# Patient Record
Sex: Male | Born: 1985 | Hispanic: Yes | Marital: Single | Smoking: Former smoker
Health system: Southern US, Community
[De-identification: ages and names within clinical notes are randomized; demographics above are authoritative.]

## PROBLEM LIST (undated history)

## (undated) DIAGNOSIS — E860 Dehydration: Secondary | ICD-10-CM

---
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
## 2018-12-01 ENCOUNTER — Encounter (HOSPITAL_COMMUNITY): Payer: Self-pay

## 2018-12-01 ENCOUNTER — Emergency Department (HOSPITAL_COMMUNITY)
Admission: EM | Admit: 2018-12-01 | Discharge: 2018-12-01 | Disposition: A | Discharge status: Home / Self Care | Payer: Self-pay | Attending: Emergency Medicine | Admitting: Emergency Medicine

## 2018-12-01 ENCOUNTER — Other Ambulatory Visit: Payer: Self-pay

## 2018-12-01 DIAGNOSIS — R197 Diarrhea, unspecified: Secondary | ICD-10-CM | POA: Insufficient documentation

## 2018-12-01 DIAGNOSIS — R1084 Generalized abdominal pain: Secondary | ICD-10-CM | POA: Insufficient documentation

## 2018-12-01 DIAGNOSIS — F17210 Nicotine dependence, cigarettes, uncomplicated: Secondary | ICD-10-CM | POA: Insufficient documentation

## 2018-12-01 DIAGNOSIS — E860 Dehydration: Secondary | ICD-10-CM | POA: Insufficient documentation

## 2018-12-01 DIAGNOSIS — R112 Nausea with vomiting, unspecified: Secondary | ICD-10-CM | POA: Insufficient documentation

## 2018-12-01 DIAGNOSIS — R6883 Chills (without fever): Secondary | ICD-10-CM | POA: Insufficient documentation

## 2018-12-01 HISTORY — DX: Dehydration: E86.0

## 2018-12-01 LAB — CBC WITH DIFFERENTIAL/PLATELET
Abs Immature Granulocytes: 0.01 10*3/uL (ref 0.00–0.07)
Basophils Absolute: 0 10*3/uL (ref 0.0–0.1)
Basophils Relative: 1 %
Eosinophils Absolute: 0 10*3/uL (ref 0.0–0.5)
Eosinophils Relative: 1 %
HCT: 51.7 % (ref 39.0–52.0)
Hemoglobin: 17.1 g/dL — ABNORMAL HIGH (ref 13.0–17.0)
Immature Granulocytes: 0 %
Lymphocytes Relative: 28 %
Lymphs Abs: 1.8 10*3/uL (ref 0.7–4.0)
MCH: 27.9 pg (ref 26.0–34.0)
MCHC: 33.1 g/dL (ref 30.0–36.0)
MCV: 84.2 fL (ref 80.0–100.0)
Monocytes Absolute: 0.5 10*3/uL (ref 0.1–1.0)
Monocytes Relative: 7 %
Neutro Abs: 3.9 10*3/uL (ref 1.7–7.7)
Neutrophils Relative %: 63 %
Platelets: 288 10*3/uL (ref 150–400)
RBC: 6.14 MIL/uL — ABNORMAL HIGH (ref 4.22–5.81)
RDW: 12.9 % (ref 11.5–15.5)
WBC: 6.2 10*3/uL (ref 4.0–10.5)
nRBC: 0 % (ref 0.0–0.2)

## 2018-12-01 LAB — URINALYSIS, ROUTINE W REFLEX MICROSCOPIC
Bacteria, UA: NONE SEEN
Bilirubin Urine: NEGATIVE
Glucose, UA: NEGATIVE mg/dL
Ketones, ur: 80 mg/dL — AB
Leukocytes,Ua: NEGATIVE
Nitrite: NEGATIVE
Protein, ur: NEGATIVE mg/dL
Specific Gravity, Urine: 1.023 (ref 1.005–1.030)
pH: 7 (ref 5.0–8.0)

## 2018-12-01 LAB — COMPREHENSIVE METABOLIC PANEL
ALT: 12 U/L (ref 0–44)
AST: 12 U/L — ABNORMAL LOW (ref 15–41)
Albumin: 4.6 g/dL (ref 3.5–5.0)
Alkaline Phosphatase: 56 U/L (ref 38–126)
Anion gap: 9 (ref 5–15)
BUN: 13 mg/dL (ref 6–20)
CO2: 22 mmol/L (ref 22–32)
Calcium: 9.5 mg/dL (ref 8.9–10.3)
Chloride: 106 mmol/L (ref 98–111)
Creatinine, Ser: 0.99 mg/dL (ref 0.61–1.24)
GFR calc Af Amer: >60 mL/min (ref >60)
GFR calc non Af Amer: >60 mL/min (ref >60)
Glucose, Bld: 89 mg/dL (ref 70–99)
Potassium: 3.6 mmol/L (ref 3.5–5.1)
Sodium: 137 mmol/L (ref 135–145)
Total Bilirubin: 1.2 mg/dL (ref 0.3–1.2)
Total Protein: 8.3 g/dL — ABNORMAL HIGH (ref 6.5–8.1)

## 2018-12-01 LAB — LIPASE, BLOOD: Lipase: 29 U/L (ref 11–51)

## 2018-12-01 MED ORDER — LACTATED RINGERS IV BOLUS
1000.0000 mL | Freq: Once | INTRAVENOUS | Status: AC
  Administered 2018-12-01: 18:00:00 1000 mL via INTRAVENOUS

## 2018-12-01 MED ORDER — ONDANSETRON HCL 4 MG/2ML IJ SOLN
4.0000 mg | Freq: Once | INTRAMUSCULAR | Status: AC
  Administered 2018-12-01: 16:00:00 4 mg via INTRAVENOUS
  Filled 2018-12-01: qty 2

## 2018-12-01 MED ORDER — FAMOTIDINE IN NACL 20-0.9 MG/50ML-% IV SOLN
20.0000 mg | Freq: Once | INTRAVENOUS | Status: AC
  Administered 2018-12-01: 18:00:00 20 mg via INTRAVENOUS
  Filled 2018-12-01: qty 50

## 2018-12-01 MED ORDER — SODIUM CHLORIDE 0.9 % IV BOLUS
1000.0000 mL | Freq: Once | INTRAVENOUS | Status: AC
  Administered 2018-12-01: 16:00:00 1000 mL via INTRAVENOUS

## 2018-12-01 MED ORDER — DICYCLOMINE HCL 20 MG PO TABS: 20.0000 mg | ORAL_TABLET | Freq: Three times a day (TID) | ORAL | 0 refills | Status: DC | PRN

## 2018-12-01 MED ORDER — PROMETHAZINE HCL 25 MG PO TABS: 25.0000 mg | ORAL_TABLET | Freq: Three times a day (TID) | ORAL | 0 refills | Status: DC | PRN

## 2018-12-01 MED ORDER — DICYCLOMINE HCL 10 MG PO CAPS
10.0000 mg | ORAL_CAPSULE | Freq: Once | ORAL | Status: AC
  Administered 2018-12-01: 18:00:00 10 mg via ORAL
  Filled 2018-12-01: qty 1

## 2018-12-01 NOTE — ED Provider Notes (Signed)
[facility] DEPT Provider Note   CSN: [id number] Arrival date & time: 12/01/18  1515    History   Chief Complaint Chief Complaint  Patient presents with  . Weakness    HPI Lawrence Fischer is a 33 y.o. male.     The history is provided by the patient. No language interpreter was used.  Weakness   Lawrence Fischer is a 33 y.o. male who presents to the Emergency Department complaining of N/V/D/abdominal pain. Resents to the emergency department complaining of five days of nausea, vomiting, diarrhea and abdominal pain. Symptoms began with severe diarrhea with greater than five episodes of watery stool daily. He has generalized abdominal cramping as well as a couple of episodes of small volume emesis daily. Overall his diarrhea is improving and his vomiting is resolved since yesterday. He denies any fevers but does have sweats that time as well as chills. No dysuria. He has experienced similar episodes in the past related to dehydration. At home he has been taking Dramamine as well is Pedialyte with no significant improvement in his symptoms. He complains of feeling it very dehydrated and weak. He has no known problems. No prior abdominal surgeries. He does smoke With pack of cigarettes daily and uses occasional marijuana.  Past Medical History:  Diagnosis Date  . Dehydration     There are no active problems to display for this patient.   Past Surgical History:  Procedure Laterality Date  . KNEE SURGERY  2004        Home Medications    Prior to Admission medications   Medication Sig Start Date End Date Taking? Authorizing Provider  dicyclomine (BENTYL) 20 MG tablet Take 1 tablet (20 mg total) by mouth 3 (three) times daily as needed for spasms. 12/01/18   Quintella Reichert, MD  promethazine (PHENERGAN) 25 MG tablet Take 1 tablet (25 mg total) by mouth every 8 (eight) hours as needed for nausea or vomiting. 12/01/18   Quintella Reichert, MD     Family History No family history on file.  Social History Social History   Tobacco Use  . Smoking status: Current Every Day Smoker    Packs/day: 0.50    Types: Cigarettes  . Smokeless tobacco: Never Used  Substance Use Topics  . Alcohol use: Never    Frequency: Never  . Drug use: Yes    Types: Marijuana     Allergies   Patient has no known allergies.   Review of Systems Review of Systems  Neurological: Positive for weakness.  All other systems reviewed and are negative.    Physical Exam Updated Vital Signs BP 129/89 (BP Location: Left Arm)   Pulse 72   Temp 98.6 F (37 C) (Oral)   Resp 16   Ht 5\' 10"  (1.778 m)   Wt 72.6 kg   SpO2 100%   BMI 22.96 kg/m   Physical Exam Vitals signs and nursing note reviewed.  Constitutional:      Appearance: He is well-developed.  HENT:     Head: Normocephalic and atraumatic.  Cardiovascular:     Rate and Rhythm: Normal rate and regular rhythm.     Heart sounds: No murmur.  Pulmonary:     Effort: Pulmonary effort is normal. No respiratory distress.     Breath sounds: Normal breath sounds.  Abdominal:     Palpations: Abdomen is soft.     Tenderness: There is no guarding or rebound.     Comments: Mild generalized abdominal  tenderness.  Musculoskeletal:        General: No swelling or tenderness.  Skin:    General: Skin is warm and dry.     Capillary Refill: Capillary refill takes less than 2 seconds.  Neurological:     Mental Status: He is alert and oriented to person, place, and time.  Psychiatric:        Behavior: Behavior normal.      ED Treatments / Results  Labs (all labs ordered are listed, but only abnormal results are displayed) Labs Reviewed  COMPREHENSIVE METABOLIC PANEL - Abnormal; Notable for the following components:      Result Value   Total Protein 8.3 (*)    AST 12 (*)    All other components within normal limits  CBC WITH DIFFERENTIAL/PLATELET - Abnormal; Notable for the following  components:   RBC 6.14 (*)    Hemoglobin 17.1 (*)    All other components within normal limits  URINALYSIS, ROUTINE W REFLEX MICROSCOPIC - Abnormal; Notable for the following components:   Hgb urine dipstick MODERATE (*)    Ketones, ur 80 (*)    All other components within normal limits  LIPASE, BLOOD    EKG None  Radiology No results found.  Procedures Procedures (including critical care time)  Medications Ordered in ED Medications  sodium chloride 0.9 % bolus 1,000 mL (0 mLs Intravenous Stopped 12/01/18 1746)  ondansetron (ZOFRAN) injection 4 mg (4 mg Intravenous Given 12/01/18 1622)  lactated ringers bolus 1,000 mL (0 mLs Intravenous Stopped 12/01/18 1927)  famotidine (PEPCID) IVPB 20 mg premix (0 mg Intravenous Stopped 12/01/18 1927)  dicyclomine (BENTYL) capsule 10 mg (10 mg Oral Given 12/01/18 1817)     Initial Impression / Assessment and Plan / ED Course  I have reviewed the triage vital signs and the nursing notes.  Pertinent labs & imaging results that were available during my care of the patient were reviewed by me and considered in my medical decision making (see chart for details).        Patient here for evaluation of nausea, vomiting, diarrhea and abdominal pain. He is non-toxic appearing on evaluation with mild abdominal tenderness. Labs are significant for elevation and hemoglobin, likely related to dehydration. Presentation is not consistent with pancreatitis, cholecystitis, appendicitis, bowel obstruction, preparation. Following treatment with IV fluids medications in the emergency department he is feeling improved. Counseled patient on home care for dehydration, vomiting, diarrhea. Discussed outpatient follow-up and return precautions.  Final Clinical Impressions(s) / ED Diagnoses   Final diagnoses:  Dehydration  Nausea vomiting and diarrhea    ED Discharge Orders         Ordered    promethazine (PHENERGAN) 25 MG tablet  Every 8 hours PRN     12/01/18 1937     dicyclomine (BENTYL) 20 MG tablet  3 times daily PRN     12/01/18 1937           Tilden Fossaees, Rhemi Balbach, MD 12/01/18 2349

## 2018-12-01 NOTE — ED Triage Notes (Signed)
Pt states that he feels dehydrated. Pt states that he has been unable to eat. Pt endorses diarrhea 4 days, and vomiting 2.5 days. Pt states that he feels very weak. Pt states he has been trying to drink pedialyte.

## 2018-12-01 NOTE — ED Notes (Signed)
Pt is able to tolerate PO liquids (Sprite).

## 2018-12-01 NOTE — ED Notes (Signed)
Pt unable to pee at this very moment. Will try again soon

## 2019-03-05 ENCOUNTER — Other Ambulatory Visit: Payer: Self-pay

## 2019-03-05 ENCOUNTER — Emergency Department (HOSPITAL_COMMUNITY)
Admission: EM | Admit: 2019-03-05 | Discharge: 2019-03-06 | Discharge status: Against Medical Advice | Payer: Medicaid Other | Attending: Emergency Medicine | Admitting: Emergency Medicine

## 2019-03-05 ENCOUNTER — Encounter (HOSPITAL_COMMUNITY): Payer: Self-pay | Admitting: Emergency Medicine

## 2019-03-05 DIAGNOSIS — R112 Nausea with vomiting, unspecified: Secondary | ICD-10-CM | POA: Diagnosis present

## 2019-03-05 DIAGNOSIS — Z5321 Procedure and treatment not carried out due to patient leaving prior to being seen by health care provider: Secondary | ICD-10-CM | POA: Insufficient documentation

## 2019-03-05 LAB — CBC
HCT: 50.1 % (ref 39.0–52.0)
Hemoglobin: 16.4 g/dL (ref 13.0–17.0)
MCH: 27.7 pg (ref 26.0–34.0)
MCHC: 32.7 g/dL (ref 30.0–36.0)
MCV: 84.6 fL (ref 80.0–100.0)
Platelets: 306 10*3/uL (ref 150–400)
RBC: 5.92 MIL/uL — ABNORMAL HIGH (ref 4.22–5.81)
RDW: 13.6 % (ref 11.5–15.5)
WBC: 9.2 10*3/uL (ref 4.0–10.5)
nRBC: 0 % (ref 0.0–0.2)

## 2019-03-05 LAB — COMPREHENSIVE METABOLIC PANEL
ALT: 7 U/L (ref 0–44)
AST: 10 U/L — ABNORMAL LOW (ref 15–41)
Albumin: 4 g/dL (ref 3.5–5.0)
Alkaline Phosphatase: 48 U/L (ref 38–126)
Anion gap: 13 (ref 5–15)
BUN: 9 mg/dL (ref 6–20)
CO2: 18 mmol/L — ABNORMAL LOW (ref 22–32)
Calcium: 9.3 mg/dL (ref 8.9–10.3)
Chloride: 107 mmol/L (ref 98–111)
Creatinine, Ser: 1.03 mg/dL (ref 0.61–1.24)
GFR calc Af Amer: >60 mL/min (ref >60)
GFR calc non Af Amer: >60 mL/min (ref >60)
Glucose, Bld: 94 mg/dL (ref 70–99)
Potassium: 4 mmol/L (ref 3.5–5.1)
Sodium: 138 mmol/L (ref 135–145)
Total Bilirubin: 0.9 mg/dL (ref 0.3–1.2)
Total Protein: 7.3 g/dL (ref 6.5–8.1)

## 2019-03-05 LAB — LACTIC ACID, PLASMA: Lactic Acid, Venous: 1.1 mmol/L (ref 0.5–1.9)

## 2019-03-05 LAB — LIPASE, BLOOD: Lipase: 28 U/L (ref 11–51)

## 2019-03-05 MED ORDER — ONDANSETRON 4 MG PO TBDP
4.0000 mg | ORAL_TABLET | Freq: Once | ORAL | Status: AC | PRN
  Administered 2019-03-05: 4 mg via ORAL
  Filled 2019-03-05: qty 1

## 2019-03-05 MED ORDER — SODIUM CHLORIDE 0.9% FLUSH: 3.0000 mL | Freq: Once | INTRAVENOUS | Status: DC

## 2019-03-05 NOTE — ED Triage Notes (Signed)
C/O of nausea, vomiting and abdominal pain X 1day. Pt states he has chronic severe dehydration and the following have symptoms have occured before. Reports he has been drinking liquid IV and plenty of water.

## 2019-03-05 NOTE — ED Notes (Signed)
Pt did not respond when called for vitals check 

## 2019-05-11 ENCOUNTER — Observation Stay (HOSPITAL_COMMUNITY)
Admission: EM | Admit: 2019-05-11 | Discharge: 2019-05-12 | Disposition: A | Discharge status: Home / Self Care | Payer: Medicaid Other | Attending: Internal Medicine | Admitting: Internal Medicine

## 2019-05-11 ENCOUNTER — Other Ambulatory Visit: Payer: Self-pay

## 2019-05-11 ENCOUNTER — Encounter (HOSPITAL_COMMUNITY): Payer: Self-pay

## 2019-05-11 ENCOUNTER — Emergency Department (HOSPITAL_COMMUNITY): Payer: Medicaid Other

## 2019-05-11 ENCOUNTER — Observation Stay (HOSPITAL_COMMUNITY): Payer: Medicaid Other

## 2019-05-11 DIAGNOSIS — I4510 Unspecified right bundle-branch block: Secondary | ICD-10-CM | POA: Insufficient documentation

## 2019-05-11 DIAGNOSIS — R823 Hemoglobinuria: Secondary | ICD-10-CM | POA: Diagnosis present

## 2019-05-11 DIAGNOSIS — Z79899 Other long term (current) drug therapy: Secondary | ICD-10-CM | POA: Diagnosis not present

## 2019-05-11 DIAGNOSIS — Z20828 Contact with and (suspected) exposure to other viral communicable diseases: Secondary | ICD-10-CM | POA: Diagnosis not present

## 2019-05-11 DIAGNOSIS — R112 Nausea with vomiting, unspecified: Secondary | ICD-10-CM | POA: Diagnosis present

## 2019-05-11 DIAGNOSIS — F1290 Cannabis use, unspecified, uncomplicated: Secondary | ICD-10-CM | POA: Insufficient documentation

## 2019-05-11 DIAGNOSIS — Z8711 Personal history of peptic ulcer disease: Secondary | ICD-10-CM | POA: Diagnosis not present

## 2019-05-11 DIAGNOSIS — F17210 Nicotine dependence, cigarettes, uncomplicated: Secondary | ICD-10-CM | POA: Insufficient documentation

## 2019-05-11 DIAGNOSIS — E876 Hypokalemia: Secondary | ICD-10-CM | POA: Diagnosis present

## 2019-05-11 DIAGNOSIS — R1115 Cyclical vomiting syndrome unrelated to migraine: Secondary | ICD-10-CM | POA: Diagnosis present

## 2019-05-11 LAB — HIV ANTIBODY (ROUTINE TESTING W REFLEX): HIV Screen 4th Generation wRfx: NONREACTIVE

## 2019-05-11 LAB — URINALYSIS, ROUTINE W REFLEX MICROSCOPIC
Bacteria, UA: NONE SEEN
Bilirubin Urine: NEGATIVE
Glucose, UA: NEGATIVE mg/dL
Ketones, ur: 80 mg/dL — AB
Leukocytes,Ua: NEGATIVE
Nitrite: NEGATIVE
Protein, ur: NEGATIVE mg/dL
Specific Gravity, Urine: 1.019 (ref 1.005–1.030)
pH: 5 (ref 5.0–8.0)

## 2019-05-11 LAB — CBC
HCT: 45.6 % (ref 39.0–52.0)
HCT: 45 % (ref 39.0–52.0)
Hemoglobin: 15.6 g/dL (ref 13.0–17.0)
Hemoglobin: 14.5 g/dL (ref 13.0–17.0)
MCH: 28.2 pg (ref 26.0–34.0)
MCH: 28 pg (ref 26.0–34.0)
MCHC: 34.2 g/dL (ref 30.0–36.0)
MCHC: 32.2 g/dL (ref 30.0–36.0)
MCV: 82.3 fL (ref 80.0–100.0)
MCV: 86.9 fL (ref 80.0–100.0)
Platelets: 306 10*3/uL (ref 150–400)
Platelets: 270 10*3/uL (ref 150–400)
RBC: 5.54 MIL/uL (ref 4.22–5.81)
RBC: 5.18 MIL/uL (ref 4.22–5.81)
RDW: 13.3 % (ref 11.5–15.5)
RDW: 13.5 % (ref 11.5–15.5)
WBC: 11.3 10*3/uL — ABNORMAL HIGH (ref 4.0–10.5)
WBC: 10.6 10*3/uL — ABNORMAL HIGH (ref 4.0–10.5)
nRBC: 0 % (ref 0.0–0.2)
nRBC: 0 % (ref 0.0–0.2)

## 2019-05-11 LAB — COMPREHENSIVE METABOLIC PANEL
ALT: 12 U/L (ref 0–44)
AST: 17 U/L (ref 15–41)
Albumin: 4.3 g/dL (ref 3.5–5.0)
Alkaline Phosphatase: 52 U/L (ref 38–126)
Anion gap: 15 (ref 5–15)
BUN: 12 mg/dL (ref 6–20)
CO2: 19 mmol/L — ABNORMAL LOW (ref 22–32)
Calcium: 9.6 mg/dL (ref 8.9–10.3)
Chloride: 105 mmol/L (ref 98–111)
Creatinine, Ser: 1.11 mg/dL (ref 0.61–1.24)
GFR calc Af Amer: >60 mL/min (ref >60)
GFR calc non Af Amer: >60 mL/min (ref >60)
Glucose, Bld: 118 mg/dL — ABNORMAL HIGH (ref 70–99)
Potassium: 3.1 mmol/L — ABNORMAL LOW (ref 3.5–5.1)
Sodium: 139 mmol/L (ref 135–145)
Total Bilirubin: 1.2 mg/dL (ref 0.3–1.2)
Total Protein: 7.4 g/dL (ref 6.5–8.1)

## 2019-05-11 LAB — CK: Total CK: 118 U/L (ref 49–397)

## 2019-05-11 LAB — HEMOGLOBIN A1C
Hgb A1c MFr Bld: 5.4 % (ref 4.8–5.6)
Mean Plasma Glucose: 108.28 mg/dL

## 2019-05-11 LAB — PHOSPHORUS: Phosphorus: 3.3 mg/dL (ref 2.5–4.6)

## 2019-05-11 LAB — MAGNESIUM: Magnesium: 1.8 mg/dL (ref 1.7–2.4)

## 2019-05-11 LAB — LIPASE, BLOOD: Lipase: 33 U/L (ref 11–51)

## 2019-05-11 LAB — TROPONIN I (HIGH SENSITIVITY): Troponin I (High Sensitivity): 4 ng/L (ref <18)

## 2019-05-11 LAB — CREATININE, SERUM
Creatinine, Ser: 1.04 mg/dL (ref 0.61–1.24)
GFR calc Af Amer: >60 mL/min (ref >60)
GFR calc non Af Amer: >60 mL/min (ref >60)

## 2019-05-11 LAB — SARS CORONAVIRUS 2 (TAT 6-24 HRS): SARS Coronavirus 2: NEGATIVE

## 2019-05-11 MED ORDER — POTASSIUM CHLORIDE 20 MEQ PO PACK
30.0000 meq | PACK | Freq: Four times a day (QID) | ORAL | Status: AC
  Administered 2019-05-11 (×2): 30 meq via ORAL
  Filled 2019-05-11 (×2): qty 2

## 2019-05-11 MED ORDER — PROMETHAZINE HCL 25 MG/ML IJ SOLN
25.0000 mg | Freq: Once | INTRAMUSCULAR | Status: AC
  Administered 2019-05-11: 05:00:00 25 mg via INTRAVENOUS
  Filled 2019-05-11 (×2): qty 1

## 2019-05-11 MED ORDER — METOCLOPRAMIDE HCL 5 MG/ML IJ SOLN
10.0000 mg | Freq: Once | INTRAMUSCULAR | Status: AC
  Administered 2019-05-11: 10 mg via INTRAVENOUS
  Filled 2019-05-11: qty 2

## 2019-05-11 MED ORDER — POTASSIUM CHLORIDE 10 MEQ/100ML IV SOLN: 10.0000 meq | INTRAVENOUS | Status: DC

## 2019-05-11 MED ORDER — FAMOTIDINE IN NACL 20-0.9 MG/50ML-% IV SOLN
20.0000 mg | Freq: Once | INTRAVENOUS | Status: AC
  Administered 2019-05-11: 05:00:00 20 mg via INTRAVENOUS
  Filled 2019-05-11: qty 50

## 2019-05-11 MED ORDER — SODIUM CHLORIDE 0.9 % IV BOLUS
1000.0000 mL | Freq: Once | INTRAVENOUS | Status: AC
  Administered 2019-05-11: 1000 mL via INTRAVENOUS

## 2019-05-11 MED ORDER — POTASSIUM CHLORIDE 10 MEQ/100ML IV SOLN
10.0000 meq | INTRAVENOUS | Status: DC
  Filled 2019-05-11: qty 100

## 2019-05-11 MED ORDER — FAMOTIDINE IN NACL 20-0.9 MG/50ML-% IV SOLN: 20.0000 mg | INTRAVENOUS | Status: DC

## 2019-05-11 MED ORDER — POTASSIUM CHLORIDE 2 MEQ/ML IV SOLN
INTRAVENOUS | Status: DC
  Filled 2019-05-11: qty 1000

## 2019-05-11 MED ORDER — ENOXAPARIN SODIUM 40 MG/0.4ML ~~LOC~~ SOLN
40.0000 mg | SUBCUTANEOUS | Status: DC
  Administered 2019-05-12: 10:00:00 40 mg via SUBCUTANEOUS
  Filled 2019-05-11: qty 0.4

## 2019-05-11 MED ORDER — POTASSIUM CHLORIDE 2 MEQ/ML IV SOLN
INTRAVENOUS | Status: DC
  Administered 2019-05-11: 13:00:00 via INTRAVENOUS
  Filled 2019-05-11 (×2): qty 1000

## 2019-05-11 MED ORDER — POTASSIUM CHLORIDE 2 MEQ/ML IV SOLN
INTRAVENOUS | Status: DC
  Administered 2019-05-11 – 2019-05-12 (×2): via INTRAVENOUS
  Filled 2019-05-11 (×2): qty 1000

## 2019-05-11 MED ORDER — PROCHLORPERAZINE EDISYLATE 10 MG/2ML IJ SOLN
10.0000 mg | INTRAMUSCULAR | Status: DC | PRN
  Administered 2019-05-11 – 2019-05-12 (×3): 10 mg via INTRAVENOUS
  Filled 2019-05-11 (×3): qty 2

## 2019-05-11 MED ORDER — ONDANSETRON HCL 4 MG/2ML IJ SOLN
4.0000 mg | Freq: Once | INTRAMUSCULAR | Status: AC | PRN
  Administered 2019-05-11: 02:00:00 4 mg via INTRAVENOUS
  Filled 2019-05-11: qty 2

## 2019-05-11 MED ORDER — ONDANSETRON HCL 4 MG/2ML IJ SOLN
4.0000 mg | Freq: Four times a day (QID) | INTRAMUSCULAR | Status: DC | PRN
  Administered 2019-05-11: 14:00:00 4 mg via INTRAVENOUS
  Filled 2019-05-11: qty 2

## 2019-05-11 MED ORDER — DICYCLOMINE HCL 10 MG PO CAPS: 10.0000 mg | ORAL_CAPSULE | Freq: Once | ORAL | Status: DC

## 2019-05-11 NOTE — ED Triage Notes (Signed)
PT reports he can not take the PO med due to nausea.

## 2019-05-11 NOTE — Progress Notes (Signed)
16:15 - Dr Eileen Stanford, Obed was messaged that Zofran administered at 13:24 did not resolve nausea

## 2019-05-11 NOTE — ED Provider Notes (Signed)
[facility]   CSN: [id number] Arrival date & time: 05/11/19  0128     History   Chief Complaint Chief Complaint  Patient presents with  . Abdominal Pain    HPI Arva ChafeMichael Antonio Oneal Deputystavia is a 33 y.o. male with no significant past medical history who presents to the emergency department with a chief complaint of vomiting.  The patient endorses countless episodes of nonbloody, nonbilious vomiting that began earlier tonight at 20:00.  He also notes that he has been having loose stools for most of the day.  He reports that he is having generalized abdominal pain brought on by vomiting.  He denies fever, chills, cough, chest pain, shortness of breath, dysuria, hematuria, constipation, melena, hematochezia, or penile or testicular pain or swelling.  No treatment prior to arrival.  He was given a dose of Zofran while he was in triage, but has continued to vomit.  He is a current, every day cigarette smoker.  He also endorses occasional marijuana use. No suspicious food intake. No history of kidney stones.       The history is provided by the patient. No language interpreter was used.    Past Medical History:  Diagnosis Date  . Dehydration     There are no active problems to display for this patient.   Past Surgical History:  Procedure Laterality Date  . KNEE SURGERY  2004        Home Medications    Prior to Admission medications   Medication Sig Start Date End Date Taking? Authorizing Provider  dicyclomine (BENTYL) 20 MG tablet Take 1 tablet (20 mg total) by mouth 3 (three) times daily as needed for spasms. Patient not taking: Reported on 05/11/2019 12/01/18 05/11/19  Tilden Fossaees, Elizabeth, MD  promethazine (PHENERGAN) 25 MG tablet Take 1 tablet (25 mg total) by mouth every 8 (eight) hours as needed for nausea or vomiting. Patient not taking: Reported on 05/11/2019 12/01/18 05/11/19  Tilden Fossaees, Elizabeth, MD    Family History No  family history on file.  Social History Social History   Tobacco Use  . Smoking status: Current Every Day Smoker    Packs/day: 0.50    Types: Cigarettes  . Smokeless tobacco: Never Used  Substance Use Topics  . Alcohol use: Never    Frequency: Never  . Drug use: Yes    Types: Marijuana     Allergies   Patient has no known allergies.   Review of Systems Review of Systems  Constitutional: Negative for appetite change, chills and fever.  Respiratory: Negative for shortness of breath.   Cardiovascular: Negative for chest pain.  Gastrointestinal: Positive for abdominal pain, nausea and vomiting. Negative for anal bleeding, blood in stool, constipation and rectal pain.  Genitourinary: Negative for dysuria, flank pain, penile swelling, scrotal swelling and testicular pain.  Musculoskeletal: Negative for back pain, myalgias, neck pain and neck stiffness.  Skin: Negative for rash.  Allergic/Immunologic: Negative for immunocompromised state.  Neurological: Negative for seizures, syncope, weakness, numbness and headaches.  Psychiatric/Behavioral: Negative for confusion.    Physical Exam Updated Vital Signs BP 111/61   Pulse (!) 53   Resp 20   SpO2 98%   Physical Exam Vitals signs and nursing note reviewed.  Constitutional:      Appearance: He is well-developed and normal weight. He is diaphoretic.  HENT:     Head: Normocephalic.     Mouth/Throat:     Comments: Mucous membranes are moist Eyes:  Conjunctiva/sclera: Conjunctivae normal.  Neck:     Musculoskeletal: Neck supple.  Cardiovascular:     Rate and Rhythm: Normal rate and regular rhythm.     Heart sounds: No murmur.  Pulmonary:     Effort: Pulmonary effort is normal. No respiratory distress.     Breath sounds: No stridor. No wheezing, rhonchi or rales.  Chest:     Chest wall: No tenderness.  Abdominal:     General: There is no distension.     Palpations: Abdomen is soft. There is no mass.     Tenderness:  There is no abdominal tenderness. There is no right CVA tenderness, left CVA tenderness, guarding or rebound.     Hernia: No hernia is present.     Comments: Abdomen is soft, nondistended.  Mild generalized tenderness to palpation throughout the abdomen.  No rebound or guarding.  Negative Murphy sign.  No tenderness over McBurney's point.  No CVA tenderness bilaterally.  Skin:    General: Skin is warm.     Capillary Refill: Capillary refill takes 2 to 3 seconds.  Neurological:     Mental Status: He is alert.  Psychiatric:        Behavior: Behavior normal.      ED Treatments / Results  Labs (all labs ordered are listed, but only abnormal results are displayed) Labs Reviewed  COMPREHENSIVE METABOLIC PANEL - Abnormal; Notable for the following components:      Result Value   Potassium 3.1 (*)    CO2 19 (*)    Glucose, Bld 118 (*)    All other components within normal limits  CBC - Abnormal; Notable for the following components:   WBC 11.3 (*)    All other components within normal limits  URINALYSIS, ROUTINE W REFLEX MICROSCOPIC - Abnormal; Notable for the following components:   Hgb urine dipstick MODERATE (*)    Ketones, ur 80 (*)    All other components within normal limits  LIPASE, BLOOD    EKG None  Radiology No results found.  Procedures Procedures (including critical care time)  Medications Ordered in ED Medications  dicyclomine (BENTYL) capsule 10 mg (has no administration in time range)  ondansetron (ZOFRAN) injection 4 mg (4 mg Intravenous Given 05/11/19 0151)  sodium chloride 0.9 % bolus 1,000 mL (1,000 mLs Intravenous New Bag/Given 05/11/19 0508)  famotidine (PEPCID) IVPB 20 mg premix (0 mg Intravenous Stopped 05/11/19 0542)  promethazine (PHENERGAN) injection 25 mg (25 mg Intravenous Given 05/11/19 0508)  metoCLOPramide (REGLAN) injection 10 mg (10 mg Intravenous Given 05/11/19 0656)  sodium chloride 0.9 % bolus 1,000 mL (1,000 mLs Intravenous New Bag/Given  05/11/19 0656)     Initial Impression / Assessment and Plan / ED Course  I have reviewed the triage vital signs and the nursing notes.  Pertinent labs & imaging results that were available during my care of the patient were reviewed by me and considered in my medical decision making (see chart for details).        33 year old male with no significant past medical history presenting with nausea and vomiting, onset tonight.   He is minimally bradycardic, but asymptomatic.  He is otherwise hemodynamically stable.  He was given Zofran in the waiting room, but continued to have vomiting.  Will trial with Phenergan and Pepcid.  He has a mild hypokalemia of 3.1 likely secondary to fluid loss from vomiting. Mild leukocytosis that is likely reactive as he is having no infectious symptoms.  He does not have  a surgical abdomen.  His urine did have hemoglobinuria, but otherwise did not appear infectious and ketonuria, treated with IV fluids. I have a low suspicion for obstructive uropathy at this time given his presenting symptoms today.  We will provide him with a urology follow-up.   On reevaluation, the patient continued to endorse severe nausea.  Will trial the patient with Reglan and Bentyl.  Could consider his symptoms to be secondary to hyperemesis cannabinoid syndrome versus viral gastritis given symptom onset of less than 12 hours ago.  Low suspicion for cholecystitis, choledocholithiasis, pancreatitis, urosepsis, testicular torsion, or bowel obstruction.  Patient care transferred to PA Albrizze at the end of my shift pending fluid challenge and clinical improvement. Patient presentation, ED course, and plan of care discussed with review of all pertinent labs and imaging. Please see his/her note for further details regarding further ED course and disposition.   Final Clinical Impressions(s) / ED Diagnoses   Final diagnoses:  Non-intractable vomiting with nausea, unspecified vomiting type   Hypokalemia  Hemoglobinuria    ED Discharge Orders    None       Barkley Boards, PA-C 05/11/19 0715    Dione Booze, MD 05/11/19 [phone]

## 2019-05-11 NOTE — ED Provider Notes (Signed)
Care assumed from [place] PA-C at shift change pending fluid challenge.  See her note for full H&P.   Briefly this is a 33 year old male presenting with nausea with emesis as well as loose stool in the last 24 hours.  He estimates over 12 episodes of nonbloody nonbilious emesis.  He admits to smoking marijuana twice a week, states he last smoked last week.  He denies history of cyclical vomiting.  Per previous provider patient has no flank pain, no focal abdominal pain. Labs are significant for hypokalemia 3.1, UA with blood and ketones. So far has had pepcid, phenergan, zofran, IVF. Nausea returned with fluid challenge.  Plan: Reglan and reassess. If tolerates PO challenge will discharge home with plan to follow up with urology outpatient for hemoglobinuria. Discharge with PO potassium.  Physical Exam  BP 128/81   Pulse 66   Temp 98.6 F (37 C) (Oral)   Resp 20   SpO2 99%   Physical Exam  PE: Constitutional: well-developed, well-nourished, no apparent distress HENT: normocephalic, atraumatic. no cervical adenopathy. Mucus membranes are dry Cardiovascular: normal rate and rhythm, distal pulses intact Pulmonary/Chest: effort normal; breath sounds clear and equal bilaterally; no wheezes or rales Abdominal: + bowel sounds in all quadrants. Non tender to palpation. No rigidity, no rebound, no guarding. No CVA tenderness Musculoskeletal: full ROM, no edema Neurological: alert with goal directed thinking Skin: warm and dry, no rash, no diaphoresis Psychiatric: normal mood and affect, normal behavior    ED Course/Procedures   Results for orders placed or performed during the hospital encounter of 05/11/19 (from the past 24 hour(s))  Lipase, blood     Status: None   Collection Time: 05/11/19  1:47 AM  Result Value Ref Range   Lipase 33 11 - 51 U/L  Comprehensive metabolic panel     Status: Abnormal   Collection Time: 05/11/19  1:47 AM  Result Value Ref Range   Sodium 139 135 - 145  mmol/L   Potassium 3.1 (L) 3.5 - 5.1 mmol/L   Chloride 105 98 - 111 mmol/L   CO2 19 (L) 22 - 32 mmol/L   Glucose, Bld 118 (H) 70 - 99 mg/dL   BUN 12 6 - 20 mg/dL   Creatinine, Ser 1.11 0.61 - 1.24 mg/dL   Calcium 9.6 8.9 - 10.3 mg/dL   Total Protein 7.4 6.5 - 8.1 g/dL   Albumin 4.3 3.5 - 5.0 g/dL   AST 17 15 - 41 U/L   ALT 12 0 - 44 U/L   Alkaline Phosphatase 52 38 - 126 U/L   Total Bilirubin 1.2 0.3 - 1.2 mg/dL   GFR calc non Af Amer >60 >60 mL/min   GFR calc Af Amer >60 >60 mL/min   Anion gap 15 5 - 15  CBC     Status: Abnormal   Collection Time: 05/11/19  1:47 AM  Result Value Ref Range   WBC 11.3 (H) 4.0 - 10.5 K/uL   RBC 5.54 4.22 - 5.81 MIL/uL   Hemoglobin 15.6 13.0 - 17.0 g/dL   HCT 45.6 39.0 - 52.0 %   MCV 82.3 80.0 - 100.0 fL   MCH 28.2 26.0 - 34.0 pg   MCHC 34.2 30.0 - 36.0 g/dL   RDW 13.3 11.5 - 15.5 %   Platelets 306 150 - 400 K/uL   nRBC 0.0 0.0 - 0.2 %  Urinalysis, Routine w reflex microscopic     Status: Abnormal   Collection Time: 05/11/19  5:14 AM  Result Value Ref Range   Color, Urine YELLOW YELLOW   APPearance CLEAR CLEAR   Specific Gravity, Urine 1.019 1.005 - 1.030   pH 5.0 5.0 - 8.0   Glucose, UA NEGATIVE NEGATIVE mg/dL   Hgb urine dipstick MODERATE (A) NEGATIVE   Bilirubin Urine NEGATIVE NEGATIVE   Ketones, ur 80 (A) NEGATIVE mg/dL   Protein, ur NEGATIVE NEGATIVE mg/dL   Nitrite NEGATIVE NEGATIVE   Leukocytes,Ua NEGATIVE NEGATIVE   RBC / HPF 6-10 0 - 5 RBC/hpf   WBC, UA 0-5 0 - 5 WBC/hpf   Bacteria, UA NONE SEEN NONE SEEN   Squamous Epithelial / LPF 0-5 0 - 5   Mucus PRESENT       MDM  Patient received in sign out. Symptoms have been less than 24 hours, likely viral.  On my exam patient looks to not feel well however is nontoxic.  He is afebrile with normal vital signs.  His abdomen is nontender, no peritoneal signs.  Given reassuring abdominal exam still have low suspicion for cholecystitis, choledocholithiasis, pancreatitis, testicular  torsion, SBO, urosepsis.  Patient has failed p.o. challenge after receiving Pepcid, Phenergan, Zofran and Reglan.  Will consult for unassigned admission for intractable vomiting. Spoke with Dr. Oswaldo Done with Internal medicine service who agrees to assume care of patient and bring into the hospital for further evaluation and management.  He is requesting CT renal be ordered and he will follow up on results.  Portions of this note were generated with Scientist, clinical (histocompatibility and immunogenetics). Dictation errors may occur despite best attempts at proofreading.      Kathyrn Lass 05/11/19 0908    Dione Booze, MD 05/11/19 2240

## 2019-05-11 NOTE — Plan of Care (Signed)

## 2019-05-11 NOTE — Discharge Instructions (Signed)
Thank you for allowing me to care for you today in the Emergency Department.   Call to schedule a follow-up appointment with urology as your urine today in the ER had microscopic blood in it.

## 2019-05-11 NOTE — ED Notes (Signed)
ED TO INPATIENT HANDOFF REPORT  ED Nurse Name and Phone #:   S Name/Age/Gender Lawrence Fischer 33 y.o. male Room/Bed: 033C/033C  Code Status   Code Status: Full Code  Home/SNF/Other Home Patient oriented to: self, place, time and situation Is this baseline? Yes   Triage Complete: Triage complete  Chief Complaint vomiting  Triage Note Pt c/o vomiting x 4 hours - actively vomiting in triage.  PT reports he can not take the PO med due to nausea.   Allergies No Known Allergies  Level of Care/Admitting Diagnosis ED Disposition    ED Disposition Condition Comment   Admit  Hospital Area: [facility] [[id number]]  Level of Care: Med-Surg [16]  Covid Evaluation: N/A  Diagnosis: Cyclical vomiting syndrome [171047]  Admitting Physician: Tyson Alias [phone]  Attending Physician: Tyson Alias [phone]  PT Class (Do Not Modify): Observation [104]  PT Acc Code (Do Not Modify): Observation [10022]       B Medical/Surgery History Past Medical History:  Diagnosis Date  . Dehydration    Past Surgical History:  Procedure Laterality Date  . KNEE SURGERY  2004     A IV Location/Drains/Wounds Patient Lines/Drains/Airways Status   Active Line/Drains/Airways    Name:   Placement date:   Placement time:   Site:   Days:   Peripheral IV 05/11/19 Right Forearm   05/11/19    0144    Forearm   less than 1          Intake/Output Last 24 hours  Intake/Output Summary (Last 24 hours) at 05/11/2019 1117 Last data filed at 05/11/2019 0945 Gross per 24 hour  Intake 2000 ml  Output -  Net 2000 ml    Labs/Imaging Results for orders placed or performed during the hospital encounter of 05/11/19 (from the past 48 hour(s))  Lipase, blood     Status: None   Collection Time: 05/11/19  1:47 AM  Result Value Ref Range   Lipase 33 11 - 51 U/L    Comment: Performed at [facility], [address] [place], [place] [address]   Comprehensive metabolic panel     Status: Abnormal   Collection Time: 05/11/19  1:47 AM  Result Value Ref Range   Sodium 139 135 - 145 mmol/L   Potassium 3.1 (L) 3.5 - 5.1 mmol/L   Chloride 105 98 - 111 mmol/L   CO2 19 (L) 22 - 32 mmol/L   Glucose, Bld 118 (H) 70 - 99 mg/dL   BUN 12 6 - 20 mg/dL   Creatinine, Ser 8.29 0.61 - 1.24 mg/dL   Calcium 9.6 8.9 - 56.2 mg/dL   Total Protein 7.4 6.5 - 8.1 g/dL   Albumin 4.3 3.5 - 5.0 g/dL   AST 17 15 - 41 U/L   ALT 12 0 - 44 U/L   Alkaline Phosphatase 52 38 - 126 U/L   Total Bilirubin 1.2 0.3 - 1.2 mg/dL   GFR calc non Af Amer >60 >60 mL/min   GFR calc Af Amer >60 >60 mL/min   Anion gap 15 5 - 15    Comment: Performed at [facility], [address] [place], [place] [address]  CBC     Status: Abnormal   Collection Time: 05/11/19  1:47 AM  Result Value Ref Range   WBC 11.3 (H) 4.0 - 10.5 K/uL   RBC 5.54 4.22 - 5.81 MIL/uL   Hemoglobin 15.6 13.0 - 17.0 g/dL   HCT  45.6 39.0 - 52.0 %   MCV 82.3 80.0 - 100.0 fL   MCH 28.2 26.0 - 34.0 pg   MCHC 34.2 30.0 - 36.0 g/dL   RDW 13.3 11.5 - 15.5 %   Platelets 306 150 - 400 K/uL   nRBC 0.0 0.0 - 0.2 %    Comment: Performed at [facility], [place] [address] [place], [place] [address]  Hemoglobin A1c     Status: None   Collection Time: 05/11/19  1:47 AM  Result Value Ref Range   Hgb A1c MFr Bld 5.4 4.8 - 5.6 %    Comment: (NOTE) Pre diabetes:          5.7%-6.4% Diabetes:              >6.4% Glycemic control for   <7.0% adults with diabetes    Mean Plasma Glucose 108.28 mg/dL    Comment: Performed at [place] [address], [place], [place] [address]  Urinalysis, Routine w reflex microscopic     Status: Abnormal   Collection Time: 05/11/19  5:14 AM  Result Value Ref Range   Color, Urine YELLOW YELLOW   APPearance CLEAR CLEAR   Specific Gravity, Urine 1.019 1.005 - 1.030   pH 5.0 5.0 - 8.0   Glucose, UA NEGATIVE NEGATIVE mg/dL   Hgb urine dipstick MODERATE (A)  NEGATIVE   Bilirubin Urine NEGATIVE NEGATIVE   Ketones, ur 80 (A) NEGATIVE mg/dL   Protein, ur NEGATIVE NEGATIVE mg/dL   Nitrite NEGATIVE NEGATIVE   Leukocytes,Ua NEGATIVE NEGATIVE   RBC / HPF 6-10 0 - 5 RBC/hpf   WBC, UA 0-5 0 - 5 WBC/hpf   Bacteria, UA NONE SEEN NONE SEEN   Squamous Epithelial / LPF 0-5 0 - 5   Mucus PRESENT     Comment: Performed at [facility], [place] [address], [place], [place] [address]  Creatinine, serum     Status: None   Collection Time: 05/11/19  9:05 AM  Result Value Ref Range   Creatinine, Ser 1.04 0.61 - 1.24 mg/dL   GFR calc non Af Amer >60 >60 mL/min   GFR calc Af Amer >60 >60 mL/min    Comment: Performed at [place] [address], [place], [place] [address]  CBC     Status: Abnormal   Collection Time: 05/11/19  9:58 AM  Result Value Ref Range   WBC 10.6 (H) 4.0 - 10.5 K/uL   RBC 5.18 4.22 - 5.81 MIL/uL   Hemoglobin 14.5 13.0 - 17.0 g/dL   HCT 45.0 39.0 - 52.0 %   MCV 86.9 80.0 - 100.0 fL   MCH 28.0 26.0 - 34.0 pg   MCHC 32.2 30.0 - 36.0 g/dL   RDW 13.5 11.5 - 15.5 %   Platelets 270 150 - 400 K/uL   nRBC 0.0 0.0 - 0.2 %    Comment: Performed at [facility], [place] [address] [place], [place] [address]   Ct Renal Stone Study  Result Date: 05/11/2019 CLINICAL DATA:  Nausea and vomiting. Hematuria. EXAM: CT ABDOMEN AND PELVIS WITHOUT CONTRAST TECHNIQUE: Multidetector CT imaging of the abdomen and pelvis was performed following the standard protocol without IV contrast. COMPARISON:  None. FINDINGS: Lower chest: Unremarkable. Hepatobiliary: No focal liver abnormality is seen. No gallstones, gallbladder wall thickening, or biliary dilatation. Pancreas: Unremarkable. No pancreatic ductal dilatation or surrounding inflammatory changes. Spleen: Normal in size without focal abnormality. Adrenals/Urinary Tract: Adrenal glands are unremarkable.  Kidneys are normal, without renal calculi, focal lesion, or hydronephrosis. Bladder is  unremarkable. Stomach/Bowel: Stomach is within normal limits. Appendix appears normal. No evidence of bowel wall thickening, distention, or inflammatory changes. Vascular/Lymphatic: No significant vascular findings are present. No enlarged abdominal or pelvic lymph nodes. Reproductive: Prostate is unremarkable. Other: No abdominal wall hernia or abnormality. No abdominopelvic ascites. Musculoskeletal: Minimal lumbar and lower thoracic spine degenerative changes. IMPRESSION: No significant abnormality. Electronically Signed   By: Beckie SaltsSteven  Reid M.D.   On: 05/11/2019 09:21    Pending Labs Unresulted Labs (From admission, onward)    Start     Ordered   05/18/19 0500  Creatinine, serum  (enoxaparin (LOVENOX)    CrCl >/= 30 ml/min)  Weekly,   R    Comments: while on enoxaparin therapy    05/11/19 0858   05/12/19 0500  Comprehensive metabolic panel  Tomorrow morning,   R     05/11/19 0858   05/12/19 0500  CBC  Tomorrow morning,   R     05/11/19 0858   05/11/19 0940  Magnesium  Add-on,   AD     05/11/19 0939   05/11/19 0940  Phosphorus  Add-on,   AD     05/11/19 0939   05/11/19 0933  Occult blood card to lab, stool RN will collect  Once,   STAT    Question:  Specimen to be collected by:  Answer:  RN will collect   05/11/19 0933   05/11/19 0856  HIV Antibody (routine testing w rflx)  (HIV Antibody (Routine testing w reflex) panel)  Once,   STAT     05/11/19 0858   05/11/19 0828  SARS CORONAVIRUS 2 (TAT 6-24 HRS) Nasopharyngeal Nasopharyngeal Swab  (Asymptomatic/Tier 2 Patients Labs)  Once,   STAT    Question Answer Comment  Is this test for diagnosis or screening Screening   Symptomatic for COVID-19 as defined by CDC No   Hospitalized for COVID-19 No   Admitted to ICU for COVID-19 No   Previously tested for COVID-19 No   Resident in a congregate (group) care setting No   Employed in healthcare setting No      05/11/19 0827   05/11/19 0147  CK  Once,   R     05/11/19 0147           Vitals/Pain Today's Vitals   05/11/19 0951 05/11/19 1000 05/11/19 1030 05/11/19 1100  BP: 132/80 139/82 125/69 127/76  Pulse: 63     Resp: 13 18 15  (!) 22  Temp:      TempSrc:      SpO2: 100%     PainSc:        Isolation Precautions No active isolations  Medications Medications  dicyclomine (BENTYL) capsule 10 mg (10 mg Oral Not Given 05/11/19 0920)  enoxaparin (LOVENOX) injection 40 mg (has no administration in time range)  lactated ringers 1,000 mL with potassium chloride 10 mEq infusion (has no administration in time range)  potassium chloride (KLOR-CON) packet 30 mEq (30 mEq Oral Given 05/11/19 1014)  ondansetron (ZOFRAN) injection 4 mg (4 mg Intravenous Given 05/11/19 0151)  sodium chloride 0.9 % bolus 1,000 mL (0 mLs Intravenous Stopped 05/11/19 0945)  famotidine (PEPCID) IVPB 20 mg premix (0 mg Intravenous Stopped 05/11/19 0542)  promethazine (PHENERGAN) injection 25 mg (25 mg Intravenous Given 05/11/19 0508)  metoCLOPramide (REGLAN) injection 10 mg (10 mg Intravenous Given 05/11/19 0656)  sodium chloride 0.9 % bolus 1,000 mL (0 mLs Intravenous  Stopped 05/11/19 0945)    Mobility walks Low fall risk   Focused Assessments Cardiac Assessment Handoff:    No results found for: CKTOTAL, CKMB, CKMBINDEX, TROPONINI No results found for: DDIMER Does the Patient currently have chest pain? no     R Recommendations: See Admitting Provider Note  Report given to:   Additional Notes:

## 2019-05-11 NOTE — H&P (Addendum)
Date: 05/11/2019               Patient Name:  Lawrence Fischer MRN: [id number]  DOB: 1986-01-07 Age / Sex: 33 y.o., male   PCP: Patient, No Pcp Per         Medical Service: Internal Medicine Teaching Service         Attending Physician: Dr. Velna Ochs, MD    First Contact: Dr. Ladona Horns Pager: [phone]  Second Contact: Dr. Nathanial Rancher Pager: [phone]       After Hours (After 5p/  First Contact Pager: [phone]  weekends / holidays): Second Contact Pager: [phone]   Chief Complaint: vomiting  History of Present Illness: Mr. Lawrence Fischer is a 33 year old M without significant PMH who presents for persistent vomiting. He was in his usual state of health until about 8 pm on 11/13 when he began experiencing nausea and vomiting. He has had about a dozen episodes of NBNB since last night. He has had several episodes of cyclical vomiting in the past 2-3 years which has warranted several ED visit. He has tried dramamine without any resolution. Denies sick contacts at home. He denies any preceding abdominal pain, dysuria, hematuria, constipation, melena, blood in stool, chest pain, shortness of breath but does endorse having liquid stools for about a day or so and subjective fevers/chills. He had one similar episode prior which lasted for about 6 days while he was in [place]. He states that usually he tries to fight off his symptoms but this time it was unbearable and has not been able to take care of his son.  He denies any medical history and states that he was medically discharged from the [facility] due to "stomach ulcers" and had a procedure performed about 10 years ago. He does not take any medications daily and does not follow-up with a primary care provider.   In the ED, pt was afebrile, borderline bradycardic, and normotensive. He received ondansetron, metoclopramide, promethazine, and famotidine in addition to a 2L NS bolus while in the ED. Lab work revealed leukocytosis with WBC of  11.3, K 3.1, lipase 33, T bili 1.2, alk phos 52, AST 17, and ALT 12. Urinalysis with moderate Hgb, 6-10 RBCs, 0-5 WBCs, and 80 ketones. CT stone study was without any acute abnormalities.   Meds:  No current facility-administered medications on file prior to encounter.    Current Outpatient Medications on File Prior to Encounter  Medication Sig Dispense Refill  . [DISCONTINUED] dicyclomine (BENTYL) 20 MG tablet Take 1 tablet (20 mg total) by mouth 3 (three) times daily as needed for spasms. (Patient not taking: Reported on 05/11/2019) 20 tablet 0  . [DISCONTINUED] promethazine (PHENERGAN) 25 MG tablet Take 1 tablet (25 mg total) by mouth every 8 (eight) hours as needed for nausea or vomiting. (Patient not taking: Reported on 05/11/2019) 8 tablet 0   Allergies: Allergies as of 05/11/2019  . (No Known Allergies)   Past Medical History:  Diagnosis Date  . Dehydration    Family History:  Hypertension - grandmother Diabetes - mother, sister with gestational diabetes  Social History: Current smoker - 1/2 ppd for 16 years. Occasional EtOH use. 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 Lives in [place] with fiance, son, and her father. Son just turned 2 at the end of October. Medically discharged from the [facility].  Review of Systems: Review of Systems  Constitutional: Positive for chills and fever.  HENT: Negative for  congestion and sore throat.   Eyes: Negative for blurred vision and double vision.  Respiratory: Negative for cough and shortness of breath.   Cardiovascular: Negative for chest pain and palpitations.  Gastrointestinal: Positive for nausea and vomiting. Negative for abdominal pain, constipation, diarrhea and heartburn.  Genitourinary: Negative for dysuria and frequency.  Musculoskeletal: Negative for myalgias.  Skin: Negative.   Neurological: Positive for dizziness. Negative for weakness and headaches.   Physical Exam: Blood pressure 128/81,  pulse 66, temperature 98.6 F (37 C), temperature source Oral, resp. rate 20, SpO2 99 %. Physical Exam Vitals signs and nursing note reviewed.  Constitutional:      General: He is not in acute distress.    Appearance: He is not ill-appearing.  HENT:     Head: Normocephalic and atraumatic.     Mouth/Throat:     Mouth: Mucous membranes are moist.  Neck:     Musculoskeletal: Normal range of motion.  Cardiovascular:     Rate and Rhythm: Normal rate and regular rhythm.     Heart sounds: Normal heart sounds.  Pulmonary:     Effort: Pulmonary effort is normal. No respiratory distress.     Breath sounds: Normal breath sounds.  Abdominal:     General: Abdomen is flat. Bowel sounds are normal. There is no distension.     Palpations: Abdomen is soft. There is no hepatomegaly.     Tenderness: There is no abdominal tenderness. There is no guarding or rebound. Negative signs include Murphy's sign.  Musculoskeletal:     Right lower leg: No edema.     Left lower leg: No edema.  Skin:    General: Skin is warm and dry.     Capillary Refill: Capillary refill takes less than 2 seconds.  Neurological:     General: No focal deficit present.     Mental Status: He is alert.  Psychiatric:        Mood and Affect: Mood normal.    EKG: personally reviewed my interpretation is sinus rhythm, normal rate.  Assessment & Plan by Problem: Active Problems:   Cyclical vomiting syndrome  Mr. Lawrence Fischer is a 33 year old M without significant PMH who presented for persistent nausea and vomiting over the past 12 hours.   Cyclical vomiting Pt with approximately a dozen episodes of non-bilious non-bloody vomiting over the last 12 hours. This is his 3rd ED visit for these symptoms over the past 5 months. Does not appear to be infectious. CT abdomen negative, no nephrolithiasis or bowel obstruction. Pt has no history of abdominal surgeries. Lab work negative for pancreatitis, cholecystitis, or choledocholithiasis with  normal lipase, T bili, alk phos, AST, and ALT. Question if his vomiting is related to marijuana use and hyperemesis cannabinoid syndrome. Would likely benefit from trial of marijuana cessation after discharge. - received 2L NS bolus in the ED - hydrate with 140m/hr LR - ondansetron 42mq6h PRN - no significant electrolyte abnormalties at this time - replete K with 3021mPO solution  - continue to monitor BMP and CBCs  - follow-up FOBT   Diet: NPO, to advance as tolerated Fluids: LR at 100m44m VTE ppx: enoxaparin 40mg77mQ daily CODE STATUS: FULL CODE  Dispo: Admit patient to Observation with expected length of stay less than 2 midnights.  Signed: JonesLadona Horns11/14/2020, 7:21 PM Pager: [phone] Medicine Teaching Service

## 2019-05-11 NOTE — ED Triage Notes (Signed)
Pt c/o vomiting x 4 hours - actively vomiting in triage.

## 2019-05-11 NOTE — ED Notes (Signed)
Patient transported to CT 

## 2019-05-12 DIAGNOSIS — Z8711 Personal history of peptic ulcer disease: Secondary | ICD-10-CM

## 2019-05-12 DIAGNOSIS — F17210 Nicotine dependence, cigarettes, uncomplicated: Secondary | ICD-10-CM

## 2019-05-12 DIAGNOSIS — R42 Dizziness and giddiness: Secondary | ICD-10-CM | POA: Diagnosis not present

## 2019-05-12 DIAGNOSIS — E876 Hypokalemia: Secondary | ICD-10-CM | POA: Diagnosis not present

## 2019-05-12 DIAGNOSIS — R935 Abnormal findings on diagnostic imaging of other abdominal regions, including retroperitoneum: Secondary | ICD-10-CM

## 2019-05-12 DIAGNOSIS — I4510 Unspecified right bundle-branch block: Secondary | ICD-10-CM

## 2019-05-12 DIAGNOSIS — R1115 Cyclical vomiting syndrome unrelated to migraine: Secondary | ICD-10-CM

## 2019-05-12 DIAGNOSIS — R319 Hematuria, unspecified: Secondary | ICD-10-CM

## 2019-05-12 DIAGNOSIS — F1290 Cannabis use, unspecified, uncomplicated: Secondary | ICD-10-CM

## 2019-05-12 LAB — CBC
HCT: 40.9 % (ref 39.0–52.0)
Hemoglobin: 13.5 g/dL (ref 13.0–17.0)
MCH: 28 pg (ref 26.0–34.0)
MCHC: 33 g/dL (ref 30.0–36.0)
MCV: 84.7 fL (ref 80.0–100.0)
Platelets: 248 10*3/uL (ref 150–400)
RBC: 4.83 MIL/uL (ref 4.22–5.81)
RDW: 13.7 % (ref 11.5–15.5)
WBC: 9.9 10*3/uL (ref 4.0–10.5)
nRBC: 0 % (ref 0.0–0.2)

## 2019-05-12 LAB — COMPREHENSIVE METABOLIC PANEL
ALT: 10 U/L (ref 0–44)
AST: 12 U/L — ABNORMAL LOW (ref 15–41)
Albumin: 3.6 g/dL (ref 3.5–5.0)
Alkaline Phosphatase: 40 U/L (ref 38–126)
Anion gap: 11 (ref 5–15)
BUN: 8 mg/dL (ref 6–20)
CO2: 21 mmol/L — ABNORMAL LOW (ref 22–32)
Calcium: 9.1 mg/dL (ref 8.9–10.3)
Chloride: 108 mmol/L (ref 98–111)
Creatinine, Ser: 1 mg/dL (ref 0.61–1.24)
GFR calc Af Amer: >60 mL/min (ref >60)
GFR calc non Af Amer: >60 mL/min (ref >60)
Glucose, Bld: 93 mg/dL (ref 70–99)
Potassium: 3.8 mmol/L (ref 3.5–5.1)
Sodium: 140 mmol/L (ref 135–145)
Total Bilirubin: 1.2 mg/dL (ref 0.3–1.2)
Total Protein: 6.2 g/dL — ABNORMAL LOW (ref 6.5–8.1)

## 2019-05-12 MED ORDER — ONDANSETRON 4 MG PO TBDP: 4.0000 mg | ORAL_TABLET | Freq: Three times a day (TID) | ORAL | 0 refills | Status: AC | PRN

## 2019-05-12 NOTE — Discharge Summary (Signed)
Name: Lawrence Fischer MRN: [id number] DOB: 01-Jul-1985 33 y.o. PCP: Patient, No Pcp Per  Date of Admission: 05/11/2019  1:34 AM Date of Discharge: 05/12/2019 Attending Physician: Velna Ochs, MD  Discharge Diagnosis: 1. Intractable nausea and vomiting  Discharge Medications: Allergies as of 05/12/2019   No Known Allergies     Medication List    STOP taking these medications   dicyclomine 20 MG tablet Commonly known as: BENTYL   promethazine 25 MG tablet Commonly known as: PHENERGAN     TAKE these medications   ondansetron 4 MG disintegrating tablet Commonly known as: ZOFRAN-ODT Take 1 tablet (4 mg total) by mouth every 8 (eight) hours as needed for up to 5 days for nausea or vomiting.       Disposition and follow-up:   [place] Chapel was discharged from [facility] in Good condition.  At the hospital follow up visit please address:  1.  Nausea and vomiting - pt with multiple recurrent episodes of persistent N/V - could benefit from outpatient gastric emptying study or EGD to rule out gastroparesis or gastritis - hyperemesis cannabinoid syndrome possible with his chronic marijuana use, but would be a diagnosis of exclusion  Gallbladder polyp vs stone found on RUQ  - would repeat RUQ and monitor lab work - pt may need cholecystomy to remove polyp   RBBB on EKG   - no prior EKGs in the chart for comparison.  - GI symptoms could be atypical cardiac etiology - repeat EKG and cardiology referral for further work up  2.  Labs / imaging needed at time of follow-up: NONE  3.  Pending labs/ test needing follow-up: Repeat RUQ ultrasound and EKG  Follow-up Appointments: Follow-up Information    [facility].   Why: Call to schedule a follow up appointment as your urine had microscopic blood in it. Contact information: [place] [facility].   Why: Call to establish care with a primary care provider Contact information: [place] [phone] [phone]          Hospital Course by problem list: 1. [name] is a 33 year old M without significant PMH who presented for persistent nausea and vomiting over the past 12 hours. Pt with approximately a dozen episodes of non-bilious non-bloody vomiting over the last 12 hours. This is his 3rd ED visit for these symptoms over the past 5 months. Does not appear to be infectious without fever, leukocytosis, or sick contacts. CT abdomen negative, no nephrolithiasis or bowel obstruction. Pt has no history of abdominal surgeries. Lab work negative for pancreatitis, cholecystitis, or choledocholithiasis with normal lipase, T bili, alk phos, AST, and ALT. He does endorse using marijuana every 2-3 days for many years. Pt with history of PUD treated approximately 10 years ago while he was in the [employer]. Presented then with a GI bleed and abdominal pain. Drinks alcohol occasionally, but denies NSAID use. RUQ ultrasound significant for "Echogenic focus within the gallbladder as described consistent with a small polyp or poorly calcified stone." Gastritis or gastroparesis possible, in addition to hyperemesis cannabinoid syndrome.   Pt was treated for 2L fluid bolus and maintenance fluids. Tried ondansetron, metoclopramide, and promethazine for his nausea and vomiting, however prochlorperazine worked the best for him. Over 24hrs, he was able to advance his diet and tolerate PO without  any further episodes of vomiting. He was discharged with instructions for outpatient follow-up for continued management of his N/V, gallbladder polyp, and ?new RBBB. Instructed pt to trial cessation of marijuana to treat potential hyperemesis cannabinoid syndrome. Pt would benefit from outpatient gastric emptying study and PPI trial outpatient.    Discharge Vitals:   BP 113/76 (BP Location: Left Arm)   Pulse 86   Temp 98.8 F (37.1 C) (Oral)   Resp 20   Ht _0  (1.803 m)   Wt 81.6 kg   SpO2 100%   BMI 25.10 kg/m   Pertinent Labs, Studies, and Procedures:  CBC Latest Ref Rng & Units 05/12/2019 05/11/2019 05/11/2019  WBC 4.0 - 10.5 K/uL 9.9 10.6(H) 11.3(H)  Hemoglobin 13.0 - 17.0 g/dL 13.5 14.5 15.6  Hematocrit 39.0 - 52.0 % 40.9 45.0 45.6  Platelets 150 - 400 K/uL 248 270 306   CMP Latest Ref Rng & Units 05/12/2019 05/11/2019 05/11/2019  Glucose 70 - 99 mg/dL 93 - 118(H)  BUN 6 - 20 mg/dL 8 - 12  Creatinine 0.61 - 1.24 mg/dL 1.00 1.04 1.11  Sodium 135 - 145 mmol/L 140 - 139  Potassium 3.5 - 5.1 mmol/L 3.8 - 3.1(L)  Chloride 98 - 111 mmol/L 108 - 105  CO2 22 - 32 mmol/L 21(L) - 19(L)  Calcium 8.9 - 10.3 mg/dL 9.1 - 9.6  Total Protein 6.5 - 8.1 g/dL 6.2(L) - 7.4  Total Bilirubin 0.3 - 1.2 mg/dL 1.2 - 1.2  Alkaline Phos 38 - 126 U/L 40 - 52  AST 15 - 41 U/L 12(L) - 17  ALT 0 - 44 U/L 10 - 12   Lab Results  Component Value Date   HGBA1C 5.4 05/11/2019   Lab Results  Component Value Date   LIPASE 33 05/11/2019   Lab Results  Component Value Date   CKTOTAL 118 05/11/2019   Urinalysis    Component Value Date/Time   COLORURINE YELLOW 05/11/2019 [facility] 05/11/2019 0514   LABSPEC 1.019 05/11/2019 [place] 5.0 05/11/2019 [place] 05/11/2019 0514   HGBUR MODERATE (A) 05/11/2019 [employer] 05/11/2019 0514   KETONESUR 80 (A) 05/11/2019 0514   PROTEINUR NEGATIVE 05/11/2019 0514   NITRITE NEGATIVE 05/11/2019 0514   LEUKOCYTESUR NEGATIVE 05/11/2019 0514    EKG Interpretation  Date/Time:  Saturday May 11 2019 09:52:24 EST Ventricular Rate:  62 PR Interval:    QRS Duration: 137 QT Interval:  414 QTC Calculation: 421 R Axis:   85 Text Interpretation: Sinus rhythm Right bundle branch block No old tracing to compare Confirmed by Dorie Rank [id number])  on 05/12/2019 4:34:03 PM      CT RENAL STONE STUDY 11/14 CLINICAL DATA:  Nausea and vomiting. Hematuria.  EXAM: CT ABDOMEN AND PELVIS WITHOUT CONTRAST  TECHNIQUE: Multidetector CT imaging of the abdomen and pelvis was performed following the standard protocol without IV contrast.  COMPARISON:  None.  FINDINGS: Lower chest: Unremarkable.  Hepatobiliary: No focal liver abnormality is seen. No gallstones, gallbladder wall thickening, or biliary dilatation.  Pancreas: Unremarkable. No pancreatic ductal dilatation or surrounding inflammatory changes.  Spleen: Normal in size without focal abnormality.  Adrenals/Urinary Tract: Adrenal glands are unremarkable. Kidneys are normal, without renal calculi, focal lesion, or hydronephrosis. Bladder is unremarkable.  Stomach/Bowel: Stomach is within normal limits. Appendix appears normal. No evidence of bowel wall thickening, distention, or inflammatory changes.  Vascular/Lymphatic: No significant vascular findings are present. No enlarged abdominal or  pelvic lymph nodes.  Reproductive: Prostate is unremarkable.  Other: No abdominal wall hernia or abnormality. No abdominopelvic ascites.  Musculoskeletal: Minimal lumbar and lower thoracic spine degenerative changes.  IMPRESSION: No significant abnormality.  US ABDOMEN LIMITED RUQ CLINICAL DATA:  Nausea and vomiting for 2 days  EXAM: ULTRASOUND ABDOMEN LIMITED RIGHT UPPER QUADRANT  COMPARISON:  None.  FINDINGS: Gallbladder:  Well distended without wall thickening. There is an echogenic focus which is nonmobile in the neck likely representing a small stone or polyp. No other calculi are seen. Gallbladder sludge is noted.  Common bile duct:  Diameter: 2.7 mm.  Liver:  No focal lesion identified. Within normal limits in parenchymal echogenicity. Portal vein is patent on color Doppler imaging with normal direction of blood flow towards the  liver.  Other: None.  IMPRESSION: Echogenic focus within the gallbladder as described consistent with a small polyp or poorly calcified stone.   Discharge Instructions: Discharge Instructions    Call MD for:  difficulty breathing, headache or visual disturbances   Complete by: As directed    Call MD for:  extreme fatigue   Complete by: As directed    Call MD for:  hives   Complete by: As directed    Call MD for:  persistant dizziness or light-headedness   Complete by: As directed    Call MD for:  persistant nausea and vomiting   Complete by: As directed    Call MD for:  redness, tenderness, or signs of infection (pain, swelling, redness, odor or green/yellow discharge around incision site)   Complete by: As directed    Call MD for:  severe uncontrolled pain   Complete by: As directed    Call MD for:  temperature >100.4   Complete by: As directed    Diet - low sodium heart healthy   Complete by: As directed    Discharge instructions   Complete by: As directed    Mr. Eddie, Payette were seen in the hospital for persistent nausea and vomiting. You were given IV fluids and anti-nausea medicine. The lab work and imaging did not show any underlying causes for your vomiting. It is possible that your episodes of cyclical vomiting are related to marijuana use. If this is the case, then quitting marijuana should resolve your symptoms.   You may also have slowed gastric emptying or stomach ulcers, which should be followed up with a primary care provider who can test for these conditions and continue to manage your symptoms at home. A prescription for an anti-nausea medicine has been sent into your pharmacy so you can use it as needed. You may also trial over-the-counter acid reducing medicines (like omeprazole) to see if this helps your symptoms.  Thank you for letting us be a part of your care!   Increase activity slowly   Complete by: As directed       Signed: Ladona Horns, MD  05/12/2019, 1:36 PM   Pager: [phone]

## 2019-05-12 NOTE — Plan of Care (Signed)

## 2019-05-12 NOTE — Progress Notes (Signed)
   Subjective: Pt seen at the bedside this morning. State he feels improved from yesterday. Was only able to drink water and ice chips overnight. Had just ordered breakfast for this morning.   Objective:  Vital signs in last 24 hours: Vitals:   05/11/19 1227 05/11/19 1618 05/11/19 2042 05/12/19 0444  BP:  135/72 138/72 108/72  Pulse:  60 68 77  Resp:    20  Temp:  98.7 F (37.1 C) 99.2 F (37.3 C) 98.4 F (36.9 C)  TempSrc:  Oral Oral Oral  SpO2:  100% 100% 99%  Weight: 81.6 kg     Height: _0  (1.803 m)      Physical Exam Vitals signs and nursing note reviewed.  Constitutional:      General: He is not in acute distress.    Appearance: He is not ill-appearing.  Pulmonary:     Effort: Pulmonary effort is normal.  Abdominal:     General: Abdomen is flat. Bowel sounds are normal. There is no distension.     Palpations: Abdomen is soft.     Tenderness: There is no abdominal tenderness.  Skin:    General: Skin is warm and dry.  Neurological:     Mental Status: He is alert.    Assessment/Plan:  Principal Problem:   Intractable cyclical vomiting with nausea Active Problems:   Hypokalemia  Mr. Czajka is a 33 year old M without significant PMH who presented for persistent nausea and vomiting for .   Cyclical vomiting Pt feels his nausea is improved, will trial diet this morning and afternoon. RUQ [place] with echogenic focus in the gallbladder, possible small polyp or poorly calcified stone. Lab work remains unremarkable without electrolyte derangements, transaminitis, or elevated T bili/Alk phos.   Question if his vomiting is related to marijuana use and hyperemesis cannabinoid syndrome. Would likely benefit from trial of marijuana cessation after discharge. Could also consider gastroparesis or gastritis with pt's history of peptic ulcer disease. Pt can be discharged if tolerating PO today and follow-up with outpatient gastric emptying study and PPI trial. If not, will get  gastric emptying study tomorrow (as it is not completed on weekends) and begin PPI after that.   - trial diet - hydrate with 154m/hr LR - continue to monitor BMP and CBCs   Hypokalemia - resolved K 3.1 on admission. Repleted with 336m PO K x 2. Repeat today 3.8.   Diet: regular Fluids: LR at 10056mr VTE ppx: enoxaparin 34m92mbQ daily CODE STATUS: FULL CODE  Dispo: Anticipated discharge this afternoon.   JoneLadona Horns 05/12/2019, 6:36 AM Pager: [phone]

## 2019-05-12 NOTE — Progress Notes (Signed)
   05/12/19 1453  AVS Discharge Documentation  AVS Discharge Instructions Including Medications Provided to patient/caregiver  Name of Person Receiving AVS Discharge Instructions Including Medications [place], Sprong  Name of Clinician That Reviewed AVS Discharge Instructions Including Medications Rona Ravens

## 2019-07-20 ENCOUNTER — Emergency Department (HOSPITAL_COMMUNITY)
Admission: EM | Admit: 2019-07-20 | Discharge: 2019-07-20 | Disposition: A | Discharge status: Home / Self Care | Payer: Medicaid Other | Attending: Emergency Medicine | Admitting: Emergency Medicine

## 2019-07-20 ENCOUNTER — Encounter (HOSPITAL_COMMUNITY): Payer: Self-pay | Admitting: Emergency Medicine

## 2019-07-20 ENCOUNTER — Other Ambulatory Visit: Payer: Self-pay

## 2019-07-20 DIAGNOSIS — R112 Nausea with vomiting, unspecified: Secondary | ICD-10-CM | POA: Insufficient documentation

## 2019-07-20 DIAGNOSIS — F17210 Nicotine dependence, cigarettes, uncomplicated: Secondary | ICD-10-CM | POA: Insufficient documentation

## 2019-07-20 DIAGNOSIS — R109 Unspecified abdominal pain: Secondary | ICD-10-CM

## 2019-07-20 DIAGNOSIS — R197 Diarrhea, unspecified: Secondary | ICD-10-CM | POA: Diagnosis not present

## 2019-07-20 DIAGNOSIS — Z79899 Other long term (current) drug therapy: Secondary | ICD-10-CM | POA: Insufficient documentation

## 2019-07-20 LAB — URINALYSIS, ROUTINE W REFLEX MICROSCOPIC
Bacteria, UA: NONE SEEN
Bilirubin Urine: NEGATIVE
Glucose, UA: NEGATIVE mg/dL
Ketones, ur: 80 mg/dL — AB
Leukocytes,Ua: NEGATIVE
Nitrite: NEGATIVE
Protein, ur: 30 mg/dL — AB
Specific Gravity, Urine: 1.026 (ref 1.005–1.030)
pH: 5 (ref 5.0–8.0)

## 2019-07-20 LAB — CBC
HCT: 49 % (ref 39.0–52.0)
Hemoglobin: 15.8 g/dL (ref 13.0–17.0)
MCH: 28 pg (ref 26.0–34.0)
MCHC: 32.2 g/dL (ref 30.0–36.0)
MCV: 86.7 fL (ref 80.0–100.0)
Platelets: 312 10*3/uL (ref 150–400)
RBC: 5.65 MIL/uL (ref 4.22–5.81)
RDW: 13.9 % (ref 11.5–15.5)
WBC: 17.2 10*3/uL — ABNORMAL HIGH (ref 4.0–10.5)
nRBC: 0 % (ref 0.0–0.2)

## 2019-07-20 LAB — COMPREHENSIVE METABOLIC PANEL
ALT: 12 U/L (ref 0–44)
AST: 13 U/L — ABNORMAL LOW (ref 15–41)
Albumin: 4.2 g/dL (ref 3.5–5.0)
Alkaline Phosphatase: 54 U/L (ref 38–126)
Anion gap: 11 (ref 5–15)
BUN: 11 mg/dL (ref 6–20)
CO2: 23 mmol/L (ref 22–32)
Calcium: 9.6 mg/dL (ref 8.9–10.3)
Chloride: 107 mmol/L (ref 98–111)
Creatinine, Ser: 1.09 mg/dL (ref 0.61–1.24)
GFR calc Af Amer: >60 mL/min (ref >60)
GFR calc non Af Amer: >60 mL/min (ref >60)
Glucose, Bld: 187 mg/dL — ABNORMAL HIGH (ref 70–99)
Potassium: 4 mmol/L (ref 3.5–5.1)
Sodium: 141 mmol/L (ref 135–145)
Total Bilirubin: 0.8 mg/dL (ref 0.3–1.2)
Total Protein: 7.2 g/dL (ref 6.5–8.1)

## 2019-07-20 LAB — LIPASE, BLOOD: Lipase: 25 U/L (ref 11–51)

## 2019-07-20 MED ORDER — HALOPERIDOL LACTATE 5 MG/ML IJ SOLN
5.0000 mg | Freq: Once | INTRAMUSCULAR | Status: AC
  Administered 2019-07-20: 5 mg via INTRAVENOUS
  Filled 2019-07-20: qty 1

## 2019-07-20 MED ORDER — PROCHLORPERAZINE MALEATE 10 MG PO TABS: 10.0000 mg | ORAL_TABLET | Freq: Two times a day (BID) | ORAL | 0 refills | Status: DC | PRN

## 2019-07-20 MED ORDER — SODIUM CHLORIDE 0.9 % IV BOLUS
1000.0000 mL | Freq: Once | INTRAVENOUS | Status: AC
  Administered 2019-07-20: 14:00:00 1000 mL via INTRAVENOUS

## 2019-07-20 MED ORDER — FAMOTIDINE IN NACL 20-0.9 MG/50ML-% IV SOLN
20.0000 mg | Freq: Once | INTRAVENOUS | Status: AC
  Administered 2019-07-20: 15:00:00 20 mg via INTRAVENOUS
  Filled 2019-07-20: qty 50

## 2019-07-20 MED ORDER — SODIUM CHLORIDE 0.9% FLUSH
3.0000 mL | Freq: Once | INTRAVENOUS | Status: AC
  Administered 2019-07-20: 15:00:00 3 mL via INTRAVENOUS

## 2019-07-20 MED ORDER — PROCHLORPERAZINE EDISYLATE 10 MG/2ML IJ SOLN
10.0000 mg | Freq: Once | INTRAMUSCULAR | Status: AC
  Administered 2019-07-20: 14:00:00 10 mg via INTRAVENOUS
  Filled 2019-07-20: qty 2

## 2019-07-20 MED ORDER — ONDANSETRON 4 MG PO TBDP: 4.0000 mg | ORAL_TABLET | Freq: Three times a day (TID) | ORAL | 0 refills | Status: DC | PRN

## 2019-07-20 MED ORDER — METOCLOPRAMIDE HCL 5 MG/ML IJ SOLN
10.0000 mg | Freq: Once | INTRAMUSCULAR | Status: AC
  Administered 2019-07-20: 15:00:00 10 mg via INTRAVENOUS
  Filled 2019-07-20: qty 2

## 2019-07-20 MED ORDER — LIDOCAINE HCL URETHRAL/MUCOSAL 2 % EX GEL: 1.0000 "application " | Freq: Once | CUTANEOUS | Status: DC

## 2019-07-20 MED ORDER — DIPHENHYDRAMINE HCL 50 MG/ML IJ SOLN
12.5000 mg | Freq: Once | INTRAMUSCULAR | Status: AC
  Administered 2019-07-20: 14:00:00 12.5 mg via INTRAVENOUS
  Filled 2019-07-20: qty 1

## 2019-07-20 MED ORDER — FAMOTIDINE 20 MG PO TABS: 20.0000 mg | ORAL_TABLET | Freq: Two times a day (BID) | ORAL | 0 refills | Status: DC

## 2019-07-20 NOTE — ED Notes (Signed)
Pt aware urine sample needed 

## 2019-07-20 NOTE — ED Provider Notes (Signed)
[facility] EMERGENCY DEPARTMENT Provider Note   CSN: [id number] Arrival date & time: 07/20/19  1215     History Chief Complaint  Patient presents with  . Abdominal Pain  . Emesis  . Diarrhea    Lawrence Fischer is a 34 y.o. male presenting for evaluation nausea, vomiting, abdominal pain.  Patient states he was awake early this morning when he developed acute onset abdominal pain.  Patient states pain is mostly in his epigastric/periumbilical area, and radiates to his back.  It is constant, nothing makes it better or worse.  Since then, he has had 15-20 episodes of emesis and 5 bowel movements.  He denies hematemesis, melena, or hematochezia.  He denies fevers, chills, chest pain, shortness breath, cough, or abnormal urination.  Patient states this feels identical to when he was in the hospital 3 months ago.  Patient states he has not stopped smoking marijuana.  He has not followed up with a GI doctor yet.  He is taking Pepto-Bismol tablets, but is not taking anything else including Protonix, PPI, or an H2 blocker.  He denies sick contacts.  He denies history of abdominal surgeries.  No previous abdominal problems.  He reports tobacco use.  Intermittent alcohol use, but none recently.  Denies drug use other than marijuana.  HPI     Past Medical History:  Diagnosis Date  . Dehydration     Patient Active Problem List   Diagnosis Date Noted  . 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 Hypokalemia 05/11/2019    Past Surgical History:  Procedure Laterality Date  . KNEE SURGERY  2004       No family history on file.  Social History   Tobacco Use  . Smoking status: Current Every Day Smoker    Packs/day: 0.50    Types: Cigarettes  . Smokeless tobacco: Never Used  Substance Use Topics  . Alcohol use: Never  . Drug use: Yes    Types: Marijuana    Home Medications Prior to Admission medications   Medication Sig Start Date End Date  Taking? Authorizing Provider  famotidine (PEPCID) 20 MG tablet Take 1 tablet (20 mg total) by mouth 2 (two) times daily. 07/20/19   Kohen Reither, PA-C  ondansetron (ZOFRAN ODT) 4 MG disintegrating tablet Take 1 tablet (4 mg total) by mouth every 8 (eight) hours as needed for nausea or vomiting. 07/20/19   Kitzia Camus, PA-C  prochlorperazine (COMPAZINE) 10 MG tablet Take 1 tablet (10 mg total) by mouth 2 (two) times daily as needed for nausea or vomiting. 07/20/19   Mayela Bullard, PA-C  dicyclomine (BENTYL) 20 MG tablet Take 1 tablet (20 mg total) by mouth 3 (three) times daily as needed for spasms. Patient not taking: Reported on 05/11/2019 12/01/18 05/11/19  Tilden Fossa, MD  promethazine (PHENERGAN) 25 MG tablet Take 1 tablet (25 mg total) by mouth every 8 (eight) hours as needed for nausea or vomiting. Patient not taking: Reported on 05/11/2019 12/01/18 05/11/19  Tilden Fossa, MD    Allergies    Patient has no known allergies.  Review of Systems   Review of Systems  Gastrointestinal: Positive for abdominal pain, diarrhea, nausea and vomiting.  All other systems reviewed and are negative.   Physical Exam Updated Vital Signs BP (!) 112/59 (BP Location: Right Arm)   Pulse (!) 56   Temp 97.9 F (36.6 C) (Oral)   Resp 16   SpO2 99%   Physical Exam Vitals and nursing note  reviewed.  Constitutional:      General: He is not in acute distress.    Appearance: He is well-developed.     Comments: Appears uncomfortable due to pain, otherwise nontoxic  HENT:     Head: Normocephalic and atraumatic.  Eyes:     Conjunctiva/sclera: Conjunctivae normal.     Pupils: Pupils are equal, round, and reactive to light.  Cardiovascular:     Rate and Rhythm: Normal rate and regular rhythm.     Pulses: Normal pulses.  Pulmonary:     Effort: Pulmonary effort is normal. No respiratory distress.     Breath sounds: Normal breath sounds. No wheezing.  Abdominal:     General: There is no  distension.     Palpations: Abdomen is soft. There is no mass.     Tenderness: There is abdominal tenderness. There is no guarding or rebound.     Comments: Tenderness palpation of central abdomen without rigidity, guarding, distention.  Negative rebound.  No CVA tenderness.  No signs of peritonitis.  Musculoskeletal:        General: Normal range of motion.     Cervical back: Normal range of motion and neck supple.  Skin:    General: Skin is warm and dry.     Capillary Refill: Capillary refill takes less than 2 seconds.  Neurological:     Mental Status: He is alert and oriented to person, place, and time.     ED Results / Procedures / Treatments   Labs (all labs ordered are listed, but only abnormal results are displayed) Labs Reviewed  COMPREHENSIVE METABOLIC PANEL - Abnormal; Notable for the following components:      Result Value   Glucose, Bld 187 (*)    AST 13 (*)    All other components within normal limits  CBC - Abnormal; Notable for the following components:   WBC 17.2 (*)    All other components within normal limits  URINALYSIS, ROUTINE W REFLEX MICROSCOPIC - Abnormal; Notable for the following components:   Hgb urine dipstick MODERATE (*)    Ketones, ur 80 (*)    Protein, ur 30 (*)    All other components within normal limits  LIPASE, BLOOD    EKG None  Radiology No results found.  Procedures Procedures (including critical care time)  Medications Ordered in ED Medications  sodium chloride flush (NS) 0.9 % injection 3 mL (3 mLs Intravenous Given 07/20/19 1528)  prochlorperazine (COMPAZINE) injection 10 mg (10 mg Intravenous Given 07/20/19 1349)  sodium chloride 0.9 % bolus 1,000 mL (0 mLs Intravenous Stopped 07/20/19 1604)  diphenhydrAMINE (BENADRYL) injection 12.5 mg (12.5 mg Intravenous Given 07/20/19 1350)  metoCLOPramide (REGLAN) injection 10 mg (10 mg Intravenous Given 07/20/19 1527)  famotidine (PEPCID) IVPB 20 mg premix (0 mg Intravenous Stopped 07/20/19  1604)  haloperidol lactate (HALDOL) injection 5 mg (5 mg Intravenous Given 07/20/19 1642)    ED Course  I have reviewed the triage vital signs and the nursing notes.  Pertinent labs & imaging results that were available during my care of the patient were reviewed by me and considered in my medical decision making (see chart for details).    MDM Rules/Calculators/A&P                      Patient presenting for evaluation nausea, vomiting, diarrhea, abdominal pain.  Physical exam shows patient appears uncomfortable, but otherwise nontoxic.  Abdominal exam is not consistent with a surgical abdomen.  Patient  has been seen previously for similar, thought to be hyperemesis cannabinoid syndrome.  Will obtain labs to rule out pancreatitis, LFT abnormalities, and to assess electrolytes.  Will give Compazine and Benadryl and fluids for symptom control, as this appears to have helped him the most last time.  On reassessment, patient reports no improvement with Compazine and Benadryl.  Will give Reglan and Pepcid and reassess.  Labs show leukocytosis of 17, likely reactive.  LFTs and bili normal, doubt gallbladder etiology.  Lipase is normal.  Electrolytes are stable.  As he has had similar symptoms in the past, and labs overall reassuring, doubt intra-abdominal perforation, obstruction, infection, or surgical abdomen.  I do not believe he needs emergent CT scan at this time.  On reassessment, patient reports symptoms are improving.  He is no longer vomiting.  Symptoms are not completely resolved, but are better.  Discussed continued symptomatic treatment and monitoring of symptoms at home.  Discussed slow progression of diet.  Discussed importance of marijuana cessation and follow-up with GI.  At this time, patient appears safe for discharge.  Return precautions given.  Patient states he understands and agrees to plan.  Final Clinical Impression(s) / ED Diagnoses Final diagnoses:  Nausea vomiting and  diarrhea  Abdominal pain, unspecified abdominal location    Rx / DC Orders ED Discharge Orders         Ordered    famotidine (PEPCID) 20 MG tablet  2 times daily     07/20/19 1710    prochlorperazine (COMPAZINE) 10 MG tablet  2 times daily PRN     07/20/19 1710    ondansetron (ZOFRAN ODT) 4 MG disintegrating tablet  Every 8 hours PRN     07/20/19 1710           Artrice Kraker, PA-C 07/20/19 1811    Davonna Belling, MD 07/20/19 2028

## 2019-07-20 NOTE — Discharge Instructions (Signed)
Take pepcid twice a day to decrease stomach acid. Use either zofran (dissolved under your tongue) or compazine as needed for nausea or vomiting. It is important that you follow-up with your stomach doctor with the below for further evaluation of your symptoms. Stop smoking marijuana.  This is likely contributing to your stomach symptoms. Return to the emergency room if you develop fevers, persistent vomiting, severe worsening abdominal pain, or with any new, worsening, concerning symptoms.

## 2019-07-20 NOTE — ED Triage Notes (Signed)
C/o generalized abd pain since 6am with nausea, vomiting, and diarrhea.

## 2019-07-20 NOTE — ED Notes (Signed)
Pt. was advised to lay in bed and be hooked to the moniter. Pt. refuses and feels more comfortable kneeled on the floor, comforts his pain.

## 2019-12-11 ENCOUNTER — Ambulatory Visit (HOSPITAL_COMMUNITY)
Admission: EM | Admit: 2019-12-11 | Discharge: 2019-12-11 | Disposition: A | Discharge status: Home / Self Care | Payer: Medicaid Other | Attending: Emergency Medicine | Admitting: Emergency Medicine

## 2019-12-11 ENCOUNTER — Encounter (HOSPITAL_COMMUNITY): Payer: Self-pay | Admitting: Emergency Medicine

## 2019-12-11 ENCOUNTER — Other Ambulatory Visit: Payer: Self-pay

## 2019-12-11 DIAGNOSIS — K0889 Other specified disorders of teeth and supporting structures: Secondary | ICD-10-CM

## 2019-12-11 DIAGNOSIS — S025XXB Fracture of tooth (traumatic), initial encounter for open fracture: Secondary | ICD-10-CM | POA: Diagnosis not present

## 2019-12-11 MED ORDER — HYDROCODONE-ACETAMINOPHEN 5-325 MG PO TABS: 1.0000 | ORAL_TABLET | Freq: Four times a day (QID) | ORAL | 0 refills | Status: DC | PRN

## 2019-12-11 MED ORDER — LIDOCAINE VISCOUS HCL 2 % MT SOLN
OROMUCOSAL | Status: AC
  Filled 2019-12-11: qty 15

## 2019-12-11 MED ORDER — PENICILLIN V POTASSIUM 500 MG PO TABS: 500.0000 mg | ORAL_TABLET | Freq: Four times a day (QID) | ORAL | 0 refills | Status: AC

## 2019-12-11 MED ORDER — IBUPROFEN 600 MG PO TABS: 600.0000 mg | ORAL_TABLET | Freq: Four times a day (QID) | ORAL | 0 refills | Status: DC | PRN

## 2019-12-11 MED ORDER — LIDOCAINE VISCOUS HCL 2 % MT SOLN: 10.0000 mL | Freq: Four times a day (QID) | OROMUCOSAL | 0 refills | Status: DC | PRN
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
## 2019-12-11 NOTE — ED Provider Notes (Signed)
HPI  SUBJECTIVE:  Lawrence Fischer is a 34 y.o. male who presents with upper and lower right molar pain after being hit by a car door on the right side of his face earlier today.  He states that the teeth were already broken, but he felt another part of a tooth break off.  States that he spat out a portion of his tooth.  He reports constant throbbing, burning pain.  He states that his entire right side of his face hurts.  Denies trismus or facial swelling.  States that these teeth are sensitive to the temperature and air.  He tried 1000 milligrams of Advil, applying BC powder directly to the teeth, ice, hot water bottle.  He states that the [facility] powder, ice, drinking and exposure to air make his symptoms worse.  No alleviating factors.  Dentist: None.  Past Medical History:  Diagnosis Date  . Dehydration     Past Surgical History:  Procedure Laterality Date  . KNEE SURGERY  2004    History reviewed. No pertinent family history.  Social History   Tobacco Use  . Smoking status: Current Every Day Smoker    Packs/day: 0.50    Types: Cigarettes  . Smokeless tobacco: Never Used  Vaping Use  . Vaping Use: Never used  Substance Use Topics  . Alcohol use: Never  . Drug use: Yes    Types: Marijuana    No current facility-administered medications for this encounter.  Current Outpatient Medications:  .  famotidine (PEPCID) 20 MG tablet, Take 1 tablet (20 mg total) by mouth 2 (two) times daily., Disp: 20 tablet, Rfl: 0 .  HYDROcodone-acetaminophen (NORCO/VICODIN) 5-325 MG tablet, Take 1-2 tablets by mouth every 6 (six) hours as needed for moderate pain or severe pain., Disp: 12 tablet, Rfl: 0 .  ibuprofen (ADVIL) 600 MG tablet, Take 1 tablet (600 mg total) by mouth every 6 (six) hours as needed., Disp: 30 tablet, Rfl: 0 .  lidocaine (XYLOCAINE) 2 % solution, Use as directed 10 mLs in the mouth or throat every 6 (six) hours as needed for mouth pain. Hold in mouth and spit. Do not  swallow., Disp: 100 mL, Rfl: 0 .  penicillin v potassium (VEETID) 500 MG tablet, Take 1 tablet (500 mg total) by mouth 4 (four) times daily for 7 days., Disp: 28 tablet, Rfl: 0 .  prochlorperazine (COMPAZINE) 10 MG tablet, Take 1 tablet (10 mg total) by mouth 2 (two) times daily as needed for nausea or vomiting., Disp: 10 tablet, Rfl: 0  No Known Allergies   ROS  As noted in HPI.   Physical Exam  BP 116/81 (BP Location: Right Arm)   Pulse 77   Temp 98.2 F (36.8 C) (Oral)   Resp 18   SpO2 99%   Constitutional: Well developed, well nourished, appears uncomfortable Eyes:  EOMI, conjunctiva normal bilaterally HENT: Normocephalic, atraumatic,mucus membranes moist.  Positive tenderness over the right zygoma.  No bruising, swelling, crepitus.  No obvious facial swelling.  No tenderness along the lower jaw.  No trismus, claudication.  Tooth #1 right upper rear wisdom tooth broken, difficult to tell if this is a class II or class III fracture given chronicity and extent of dental decay.  Tooth #32 right lower impacted wisdom tooth also broken, tender to palpation.  No exposed pulp. Respiratory: Normal inspiratory effort Cardiovascular: Normal rate GI: nondistended skin: No rash, skin intact Musculoskeletal: no deformities Neurologic: Alert & oriented x 3, no focal neuro deficits Psychiatric: Speech  and behavior appropriate   ED Course   Medications - No data to display  No orders of the defined types were placed in this encounter.   No results found for this or any previous visit (from the past 24 hour(s)). No results found.  ED Clinical Impression  1. Pain, dental   2. Open fracture of tooth, initial encounter      ED Assessment/Plan  Do not feel the patient needs a Panorex or facial CT.  Patient with 2 broken teeth secondary to trauma.  Do not have calcium hydroxide dressing available here.  Patient given viscous lidocaine.  Advised patient to look for temporary dental  filling at the pharmacy.  Gave him referral to Dr. Perlie Gold at urgent tooth for evaluation tomorrow.  Also dental list.  Will send home with ibuprofen and a Tylenol containing product 3 or 4 times a day as needed for pain.  600 mg ibuprofen/1000 mg Tylenol for mild to moderate pain, ibuprofen 2 1-2 Norco for severe pain.  Viscous lidocaine.  Penicillin as prophylaxis.  Warm salt water rinses.  [place] [facility]arcotic database reviewed for this patient, and feel that the risk/benefit ratio today is favorable for proceeding with a prescription for controlled substance.  No opiate prescriptions in 2 years  Discussed MDM, treatment plan, and plan for follow-up with patient. Discussed sn/sx that should prompt return to the ED. patient agrees with plan.   Meds ordered this encounter  Medications  . penicillin v potassium (VEETID) 500 MG tablet    Sig: Take 1 tablet (500 mg total) by mouth 4 (four) times daily for 7 days.    Dispense:  28 tablet    Refill:  0  . HYDROcodone-acetaminophen (NORCO/VICODIN) 5-325 MG tablet    Sig: Take 1-2 tablets by mouth every 6 (six) hours as needed for moderate pain or severe pain.    Dispense:  12 tablet    Refill:  0  . lidocaine (XYLOCAINE) 2 % solution    Sig: Use as directed 10 mLs in the mouth or throat every 6 (six) hours as needed for mouth pain. Hold in mouth and spit. Do not swallow.    Dispense:  100 mL    Refill:  0  . ibuprofen (ADVIL) 600 MG tablet    Sig: Take 1 tablet (600 mg total) by mouth every 6 (six) hours as needed.    Dispense:  30 tablet    Refill:  0    *This clinic note was created using [employer]. Therefore, there may be occasional mistakes despite careful proofreading.   ?   Melynda Ripple, MD 12/12/19 [phone]

## 2019-12-11 NOTE — ED Notes (Signed)
Dr Chaney Malling prepared a referral form for dentist/copy made to be scanned into medical record.

## 2019-12-11 NOTE — Discharge Instructions (Addendum)
Follow-up at urgent tooth or with one of the dentists listed below tomorrow.  Try [facility] temporary filling to help with the pain until you are seen by a dentist tomorrow.  Start the penicillin.  Take ibuprofen and a Tylenol containing product 3 or 4 times a day.  Ibuprofen/1000 mg of Tylenol for mild to moderate pain or ibuprofen/1-2 Norco for severe pain.  Do not take the Tylenol and Norco as we discussed.  Take one of the other.  Do not exceed 4000 mg of Tylenol from all sources in 1 day.   [facility] [phone] extension [phone]  Dr. Lawrence Marseilles [phone] [address]  [place] [phone] [address] [facility] [place].  [facility] [phone] extension [phone] [place], [place], [address] First come first serve for the first 10 clients.  May do simple extractions only, no wisdom teeth or surgery.  You may try the second for Thursday of the month starting at 6:30 AM.  [facility] You may call the school to see if they are still helping to provide dental care for emergent cases.

## 2020-02-21 ENCOUNTER — Encounter (HOSPITAL_COMMUNITY): Payer: Self-pay | Admitting: Emergency Medicine

## 2020-02-21 ENCOUNTER — Emergency Department (HOSPITAL_COMMUNITY)
Admission: EM | Admit: 2020-02-21 | Discharge: 2020-02-21 | Disposition: A | Discharge status: Home / Self Care | Payer: Medicaid Other | Attending: Emergency Medicine | Admitting: Emergency Medicine

## 2020-02-21 DIAGNOSIS — R197 Diarrhea, unspecified: Secondary | ICD-10-CM | POA: Insufficient documentation

## 2020-02-21 DIAGNOSIS — R112 Nausea with vomiting, unspecified: Secondary | ICD-10-CM | POA: Diagnosis not present

## 2020-02-21 DIAGNOSIS — Z5321 Procedure and treatment not carried out due to patient leaving prior to being seen by health care provider: Secondary | ICD-10-CM | POA: Diagnosis not present

## 2020-02-21 DIAGNOSIS — R109 Unspecified abdominal pain: Secondary | ICD-10-CM | POA: Diagnosis not present

## 2020-02-21 LAB — CBC
HCT: 44.8 % (ref 39.0–52.0)
Hemoglobin: 15.1 g/dL (ref 13.0–17.0)
MCH: 28.4 pg (ref 26.0–34.0)
MCHC: 33.7 g/dL (ref 30.0–36.0)
MCV: 84.2 fL (ref 80.0–100.0)
Platelets: 271 10*3/uL (ref 150–400)
RBC: 5.32 MIL/uL (ref 4.22–5.81)
RDW: 13.6 % (ref 11.5–15.5)
WBC: 10.1 10*3/uL (ref 4.0–10.5)
nRBC: 0 % (ref 0.0–0.2)

## 2020-02-21 LAB — COMPREHENSIVE METABOLIC PANEL
ALT: 13 U/L (ref 0–44)
AST: 14 U/L — ABNORMAL LOW (ref 15–41)
Albumin: 4.2 g/dL (ref 3.5–5.0)
Alkaline Phosphatase: 43 U/L (ref 38–126)
Anion gap: 10 (ref 5–15)
BUN: 15 mg/dL (ref 6–20)
CO2: 21 mmol/L — ABNORMAL LOW (ref 22–32)
Calcium: 8.9 mg/dL (ref 8.9–10.3)
Chloride: 107 mmol/L (ref 98–111)
Creatinine, Ser: 0.92 mg/dL (ref 0.61–1.24)
GFR calc Af Amer: >60 mL/min (ref >60)
GFR calc non Af Amer: >60 mL/min (ref >60)
Glucose, Bld: 117 mg/dL — ABNORMAL HIGH (ref 70–99)
Potassium: 3.5 mmol/L (ref 3.5–5.1)
Sodium: 138 mmol/L (ref 135–145)
Total Bilirubin: 0.8 mg/dL (ref 0.3–1.2)
Total Protein: 7.2 g/dL (ref 6.5–8.1)

## 2020-02-21 LAB — LIPASE, BLOOD: Lipase: 29 U/L (ref 11–51)

## 2020-02-21 NOTE — ED Triage Notes (Signed)
Per EMS-3-4 days of N/V/D-some abdominal pain-intermittent issues for years, cant find what is causing symptoms-does smoke THC, cant remember last time he smoked-20g in right hand-4 mg of zofran and 500 cc of NS given in route

## 2020-02-21 NOTE — ED Notes (Signed)
Pt in lobby demanding someone remove the IV in his wrist and stated "i've got to go." This writer removed 20g IV from pt's right wrist. Catheter intact and area bandaged with bleeding controlled.

## 2020-07-11 ENCOUNTER — Ambulatory Visit (HOSPITAL_COMMUNITY): Admit: 2020-07-11 | Discharge status: Against Medical Advice | Payer: Medicaid Other

## 2020-07-15 ENCOUNTER — Emergency Department (HOSPITAL_COMMUNITY)
Admission: EM | Admit: 2020-07-15 | Discharge: 2020-07-15 | Disposition: A | Discharge status: Home / Self Care | Payer: Medicaid Other | Attending: Emergency Medicine | Admitting: Emergency Medicine

## 2020-07-15 ENCOUNTER — Encounter (HOSPITAL_COMMUNITY): Payer: Self-pay

## 2020-07-15 ENCOUNTER — Other Ambulatory Visit: Payer: Self-pay

## 2020-07-15 ENCOUNTER — Emergency Department (HOSPITAL_COMMUNITY): Payer: Medicaid Other

## 2020-07-15 DIAGNOSIS — R001 Bradycardia, unspecified: Secondary | ICD-10-CM | POA: Diagnosis not present

## 2020-07-15 DIAGNOSIS — R112 Nausea with vomiting, unspecified: Secondary | ICD-10-CM

## 2020-07-15 DIAGNOSIS — U071 COVID-19: Secondary | ICD-10-CM | POA: Insufficient documentation

## 2020-07-15 DIAGNOSIS — F17210 Nicotine dependence, cigarettes, uncomplicated: Secondary | ICD-10-CM | POA: Diagnosis not present

## 2020-07-15 DIAGNOSIS — M79632 Pain in left forearm: Secondary | ICD-10-CM

## 2020-07-15 LAB — COMPREHENSIVE METABOLIC PANEL
ALT: 16 U/L (ref 0–44)
AST: 24 U/L (ref 15–41)
Albumin: 4.3 g/dL (ref 3.5–5.0)
Alkaline Phosphatase: 52 U/L (ref 38–126)
Anion gap: 13 (ref 5–15)
BUN: 12 mg/dL (ref 6–20)
CO2: 21 mmol/L — ABNORMAL LOW (ref 22–32)
Calcium: 9.4 mg/dL (ref 8.9–10.3)
Chloride: 104 mmol/L (ref 98–111)
Creatinine, Ser: 1.1 mg/dL (ref 0.61–1.24)
GFR, Estimated: >60 mL/min (ref >60)
Glucose, Bld: 114 mg/dL — ABNORMAL HIGH (ref 70–99)
Potassium: 4.1 mmol/L (ref 3.5–5.1)
Sodium: 138 mmol/L (ref 135–145)
Total Bilirubin: 0.8 mg/dL (ref 0.3–1.2)
Total Protein: 7.5 g/dL (ref 6.5–8.1)

## 2020-07-15 LAB — CBC WITH DIFFERENTIAL/PLATELET
Abs Immature Granulocytes: 0.01 10*3/uL (ref 0.00–0.07)
Basophils Absolute: 0 10*3/uL (ref 0.0–0.1)
Basophils Relative: 0 %
Eosinophils Absolute: 0 10*3/uL (ref 0.0–0.5)
Eosinophils Relative: 0 %
HCT: 47.4 % (ref 39.0–52.0)
Hemoglobin: 15.6 g/dL (ref 13.0–17.0)
Immature Granulocytes: 0 %
Lymphocytes Relative: 20 %
Lymphs Abs: 0.8 10*3/uL (ref 0.7–4.0)
MCH: 27.7 pg (ref 26.0–34.0)
MCHC: 32.9 g/dL (ref 30.0–36.0)
MCV: 84 fL (ref 80.0–100.0)
Monocytes Absolute: 0.4 10*3/uL (ref 0.1–1.0)
Monocytes Relative: 10 %
Neutro Abs: 2.7 10*3/uL (ref 1.7–7.7)
Neutrophils Relative %: 70 %
Platelets: 238 10*3/uL (ref 150–400)
RBC: 5.64 MIL/uL (ref 4.22–5.81)
RDW: 13.9 % (ref 11.5–15.5)
WBC: 3.8 10*3/uL — ABNORMAL LOW (ref 4.0–10.5)
nRBC: 0 % (ref 0.0–0.2)

## 2020-07-15 LAB — LIPASE, BLOOD: Lipase: 26 U/L (ref 11–51)

## 2020-07-15 LAB — POC SARS CORONAVIRUS 2 AG -  ED: SARS Coronavirus 2 Ag: POSITIVE — AB

## 2020-07-15 MED ORDER — ONDANSETRON 4 MG PO TBDP: ORAL_TABLET | ORAL | 0 refills | Status: DC

## 2020-07-15 MED ORDER — ONDANSETRON HCL 4 MG/2ML IJ SOLN
4.0000 mg | Freq: Once | INTRAMUSCULAR | Status: AC
  Administered 2020-07-15: 4 mg via INTRAVENOUS
  Filled 2020-07-15: qty 2

## 2020-07-15 MED ORDER — SODIUM CHLORIDE 0.9 % IV BOLUS
1000.0000 mL | Freq: Once | INTRAVENOUS | Status: AC
  Administered 2020-07-15: 1000 mL via INTRAVENOUS

## 2020-07-15 MED ORDER — METOCLOPRAMIDE HCL 5 MG/ML IJ SOLN
10.0000 mg | Freq: Once | INTRAMUSCULAR | Status: AC
  Administered 2020-07-15: 10 mg via INTRAVENOUS
  Filled 2020-07-15: qty 2

## 2020-07-15 MED ORDER — DROPERIDOL 2.5 MG/ML IJ SOLN
2.5000 mg | Freq: Once | INTRAMUSCULAR | Status: AC
  Administered 2020-07-15: 2.5 mg via INTRAVENOUS
  Filled 2020-07-15: qty 2

## 2020-07-15 MED ORDER — MORPHINE SULFATE (PF) 4 MG/ML IV SOLN
4.0000 mg | Freq: Once | INTRAVENOUS | Status: AC
  Administered 2020-07-15: 4 mg via INTRAVENOUS
  Filled 2020-07-15: qty 1

## 2020-07-15 MED ORDER — IBUPROFEN 600 MG PO TABS: 600.0000 mg | ORAL_TABLET | Freq: Four times a day (QID) | ORAL | 0 refills | Status: DC | PRN

## 2020-07-15 MED ORDER — DICYCLOMINE HCL 10 MG/ML IM SOLN
20.0000 mg | Freq: Once | INTRAMUSCULAR | Status: AC
  Administered 2020-07-15: 20 mg via INTRAMUSCULAR
  Filled 2020-07-15: qty 2

## 2020-07-15 MED ORDER — FAMOTIDINE IN NACL 20-0.9 MG/50ML-% IV SOLN
20.0000 mg | Freq: Once | INTRAVENOUS | Status: AC
  Administered 2020-07-15: 20 mg via INTRAVENOUS
  Filled 2020-07-15: qty 50

## 2020-07-15 NOTE — ED Triage Notes (Signed)
Patient complains of left wrist pain for several days, does repetitive motion with work. Patient states that he has tendonitis.

## 2020-07-15 NOTE — Discharge Instructions (Signed)
You have tested positive for COVID-19.  Please continue to quarantine at home and monitor your symptoms closely.  Symptoms commonly last 10 to 14 days.  Your symptoms may get worse before they start to improve.  You can treat your symptoms supportively with Tylenol 1000 mg and Motrin 600 mg every 6 hours.  To help with nausea and vomiting you can use Zofran as prescribed.  Make sure you are drinking plenty of fluids and staying well-hydrated.  You can purchase a home pulse ox to help monitor your oxygen levels at home if these drop below 90% persistently you should return to the ED for reevaluation.  If you are unable to keep down fluids and have persistent vomiting or any other new or concerning symptoms you should also return.  Please if you avoid using marijuana as it may worsen your vomiting symptoms.  The x-rays of your forearm look good today and I do suspect this is likely tendinitis due to recurrent motions at work, use the Velcro wrist brace provided.  Ibuprofen and Tylenol will help with pain and inflammation.  Follow-up with your PCP if symptoms or not improving.

## 2020-07-15 NOTE — ED Provider Notes (Signed)
[facility] EMERGENCY DEPARTMENT Provider Note   CSN: [id number] Arrival date & time: 07/15/20  1253     History No chief complaint on file.   Lawrence Fischer is a 35 y.o. male.  Lawrence Fischer is a 35 y.o. male with history of nausea and vomiting, who presents to the ED for evaluation of forearm pain but patient reports he began to feel nauseous on his way to the emergency department and since arriving here has had multiple episodes of vomiting.  He reports he has a history of similar episodes of vomiting and has been seen in the ED multiple times for this previously.  Symptoms just began today.  Patient reports he has vomited at least 6 times here in the waiting room.  He denies any blood in his vomit.  Reports generalized cramping abdominal pain associated with this severe nausea.  Denies diarrhea, constipation or blood in his stool.  No fevers or chills.  No associated chest pain or shortness of breath, no cough, rhinorrhea or sore throat.  Has had similar symptoms before, does admit to daily marijuana use.  No antiemetics prior to arrival.  Patient initially was planning to come to the emergency department for evaluation of left forearm pain he reports there is a soreness that is worse with any movements.  He recently started a job at [employer] where he does constant repetitive movements and after this this pain began, denies any injury or trauma to the arm.  No associated redness, wounds or swelling.  No associated numbness, tingling or weakness.  No meds prior to arrival.  He has been putting an Ace wrap around his forearm.  No other aggravating or alleviating factors.        Past Medical History:  Diagnosis Date  . Dehydration     Patient Active Problem List   Diagnosis Date Noted  . 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 Hypokalemia 05/11/2019    Past Surgical History:  Procedure Laterality Date  . KNEE SURGERY  2004        No family history on file.  Social History   Tobacco Use  . Smoking status: Current Every Day Smoker    Packs/day: 0.50    Types: Cigarettes  . Smokeless tobacco: Never Used  Vaping Use  . Vaping Use: Never used  Substance Use Topics  . Alcohol use: Never  . Drug use: Yes    Types: Marijuana    Home Medications Prior to Admission medications   Medication Sig Start Date End Date Taking? Authorizing Provider  ibuprofen (ADVIL) 600 MG tablet Take 1 tablet (600 mg total) by mouth every 6 (six) hours as needed. 07/15/20  Yes Dartha Lodge, PA-C  ondansetron (ZOFRAN ODT) 4 MG disintegrating tablet 4mg  ODT q4 hours prn nausea/vomit 07/15/20  Yes Dillion Stowers, 07/17/20, PA-C  famotidine (PEPCID) 20 MG tablet Take 1 tablet (20 mg total) by mouth 2 (two) times daily. 07/20/19   Caccavale, Sophia, PA-C  HYDROcodone-acetaminophen (NORCO/VICODIN) 5-325 MG tablet Take 1-2 tablets by mouth every 6 (six) hours as needed for moderate pain or severe pain. 12/11/19   12/13/19, MD  lidocaine (XYLOCAINE) 2 % solution Use as directed 10 mLs in the mouth or throat every 6 (six) hours as needed for mouth pain. Hold in mouth and spit. Do not swallow. 12/11/19   12/13/19, MD  prochlorperazine (COMPAZINE) 10 MG tablet Take 1 tablet (10 mg total) by mouth 2 (two) times  daily as needed for nausea or vomiting. 07/20/19   Caccavale, Sophia, PA-C  dicyclomine (BENTYL) 20 MG tablet Take 1 tablet (20 mg total) by mouth 3 (three) times daily as needed for spasms. Patient not taking: Reported on 05/11/2019 12/01/18 05/11/19  Tilden Fossa, MD  promethazine (PHENERGAN) 25 MG tablet Take 1 tablet (25 mg total) by mouth every 8 (eight) hours as needed for nausea or vomiting. Patient not taking: Reported on 05/11/2019 12/01/18 05/11/19  Tilden Fossa, MD    Allergies    Patient has no known allergies.  Review of Systems   Review of Systems  Constitutional: Negative for chills and fever.  HENT:  Negative.   Respiratory: Negative for cough and shortness of breath.   Cardiovascular: Negative for chest pain.  Gastrointestinal: Positive for abdominal pain, nausea and vomiting. Negative for blood in stool and diarrhea.  Genitourinary: Negative for dysuria, flank pain and frequency.  Musculoskeletal: Positive for arthralgias and myalgias. Negative for back pain and neck pain.  Skin: Negative for color change, rash and wound.  Neurological: Negative for dizziness, weakness, light-headedness, numbness and headaches.  All other systems reviewed and are negative.   Physical Exam Updated Vital Signs BP 115/75 (BP Location: Right Arm)   Pulse 85   Temp 98.4 F (36.9 C) (Oral)   Resp 16   SpO2 98%   Physical Exam Vitals and nursing note reviewed.  Constitutional:      General: He is not in acute distress.    Appearance: Normal appearance. He is well-developed, normal weight and well-nourished. He is ill-appearing. He is not diaphoretic.     Comments: Pt actively vomiting and somewhat ill appearing, but not in acute distress  HENT:     Head: Normocephalic and atraumatic.     Mouth/Throat:     Mouth: Oropharynx is clear and moist. Mucous membranes are moist.     Pharynx: Oropharynx is clear.  Eyes:     General:        Right eye: No discharge.        Left eye: No discharge.     Extraocular Movements: EOM normal.  Cardiovascular:     Rate and Rhythm: Normal rate and regular rhythm.     Pulses: Normal pulses and intact distal pulses.     Heart sounds: Normal heart sounds. No murmur heard. No friction rub. No gallop.   Pulmonary:     Effort: Pulmonary effort is normal. No respiratory distress.     Breath sounds: Normal breath sounds. No wheezing or rales.     Comments: Respirations equal and unlabored, patient able to speak in full sentences, lungs clear to auscultation bilaterally  Abdominal:     General: Bowel sounds are normal. There is no distension.     Palpations: Abdomen  is soft. There is no mass.     Tenderness: There is abdominal tenderness. There is no guarding.     Comments: Abdomen is soft, non distended, bowel sounds presents, there is some generalized abdominal tenderness that does not localize, no guarding or peritoneal signs  Musculoskeletal:        General: Tenderness present. No deformity or edema.     Cervical back: Neck supple.     Comments: There is tenderness present over the left forearm, which is worse with movement, no swelling or deformity, no overlying redness, wounds or skin changes, distal pulses 2+, normal sensation and strength  Skin:    General: Skin is warm and dry.  Capillary Refill: Capillary refill takes less than 2 seconds.  Neurological:     Mental Status: He is alert and oriented to person, place, and time.     Coordination: Coordination normal.     Comments: Speech is clear, able to follow commands Moves extremities without ataxia, coordination intact  Psychiatric:        Mood and Affect: Mood normal.        Behavior: Behavior normal.     ED Results / Procedures / Treatments   Labs (all labs ordered are listed, but only abnormal results are displayed) Labs Reviewed  COMPREHENSIVE METABOLIC PANEL - Abnormal; Notable for the following components:      Result Value   CO2 21 (*)    Glucose, Bld 114 (*)    All other components within normal limits  CBC WITH DIFFERENTIAL/PLATELET - Abnormal; Notable for the following components:   WBC 3.8 (*)    All other components within normal limits  POC SARS CORONAVIRUS 2 AG -  ED - Abnormal; Notable for the following components:   SARS Coronavirus 2 Ag POSITIVE (*)    All other components within normal limits  LIPASE, BLOOD    EKG EKG Interpretation  Date/Time:  Wednesday July 15 2020 18:00:49 EST Ventricular Rate:  58 PR Interval:  134 QRS Duration: 126 QT Interval:  436 QTC Calculation: 428 R Axis:   88 Text Interpretation: Sinus bradycardia Right bundle branch  block Abnormal ECG No significant change since last tracing Confirmed by Melene PlanFloyd, Dan [phone]([id number]) on 07/17/2020 8:42:41 AM   Radiology DG Forearm Left  Result Date: 07/15/2020 CLINICAL DATA:  Left forearm pain.  No known injury. EXAM: LEFT FOREARM - 2 VIEW COMPARISON:  None. FINDINGS: There is no evidence of fracture or other focal bone lesions. Soft tissues are unremarkable. IMPRESSION: Normal exam. Electronically Signed   By: Drusilla Kannerhomas  Dalessio M.D.   On: 07/15/2020 16:04     Procedures Procedures (including critical care time)  Medications Ordered in ED Medications  ondansetron (ZOFRAN) injection 4 mg (4 mg Intravenous Given 07/15/20 1639)  sodium chloride 0.9 % bolus 1,000 mL (0 mLs Intravenous Stopped 07/15/20 1926)  famotidine (PEPCID) IVPB 20 mg premix (0 mg Intravenous Stopped 07/15/20 1737)  metoCLOPramide (REGLAN) injection 10 mg (10 mg Intravenous Given 07/15/20 1640)  dicyclomine (BENTYL) injection 20 mg (20 mg Intramuscular Given 07/15/20 1656)  morphine 4 MG/ML injection 4 mg (4 mg Intravenous Given 07/15/20 1928)  droperidol (INAPSINE) 2.5 MG/ML injection 2.5 mg (2.5 mg Intravenous Given 07/15/20 1837)  ondansetron (ZOFRAN) injection 4 mg (4 mg Intravenous Given 07/15/20 1927)    ED Course  I have reviewed the triage vital signs and the nursing notes.  Pertinent labs & imaging results that were available during my care of the patient were reviewed by me and considered in my medical decision making (see chart for details).    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  Neurovascularly intact.  Suspect tendinitis from repetitive movements, x-ray is unremarkable.    Patient became nauseous while in route to the emergency  department and has vomited multiple times while waiting, has  history of similar episodes of persistent nausea and vomiting, does use marijuana, reports associated cramping generalized abdominal pain.  Patient actively vomiting on my evaluation, but vitals are normal, patient with some generalized tenderness but no peritoneal signs.  Will check abdominal labs and treat with IV fluids, antiemetics, Pepcid and then will reevaluate, do not feel that imaging is indicated at this time.  I have independently ordered, reviewed and interpreted all labs and imaging: CBC: Leukopenia of 3.8 noted, normal hemoglobin CMP: Glucose of 114 and CO2 of twenty-one but no other electrolyte derangements, normal renal and liver function Lipase: WNL  Given leukopenia I sent rapid antigen COVID test which returned positive this could certainly be contributing to patient's GI symptoms, he does not have any respiratory symptoms or fever and is overall well-appearing.  Initial round of antiemetics did not stop vomiting, checked EKG with normal QTC, will treat with droperidol as I am concerned given patient's history of prior similar episodes of nausea and vomiting that cannabis hyperemesis could be contributing.  After droperidol patient is feeling much better, he is no longer vomiting and has been able to tolerate p.o. fluids here in the ED.  Will discharge with Zofran, encourage NSAIDs and Tylenol for treatment of forearm pain which I suspect is tendinitis due to repetitive motions, I also provided patient with Velcro wrist splint.  Encourage PCP follow-up.  Encourage patient to avoid marijuana.  Discussed quarantine and return precautions regarding COVID as well as symptomatic treatment at home.  Patient expresses understanding and agreement.  Discharged home in good condition.   Antwon Rochin was evaluated in Emergency Department on 07/18/2020 for the symptoms described in the history of present illness. He was  evaluated in the context of the global COVID-19 pandemic, which necessitated consideration that the patient might be at risk for infection with the SARS-CoV-2 virus that causes COVID-19. Institutional protocols and algorithms that pertain to the evaluation of patients at risk for COVID-19 are in a state of rapid change based on information released by regulatory bodies including the CDC and federal and state organizations. These policies and algorithms were followed during the patient's care in the ED.  Final Clinical Impression(s) / ED Diagnoses Final diagnoses:  COVID-19 virus infection  Non-intractable vomiting with nausea, unspecified vomiting type  Pain of left forearm    Rx / DC Orders ED Discharge Orders         Ordered    ondansetron (ZOFRAN ODT) 4 MG disintegrating tablet        07/15/20 2005    ibuprofen (ADVIL) 600 MG tablet  Every 6 hours PRN        07/15/20 2005           Dartha Lodge, [place] 07/18/20 1527    Blane Ohara, MD 07/18/20 1731

## 2020-07-15 NOTE — ED Notes (Signed)
Lawrence Fischer (Wife#[phone])called/would like for nurse to give him something for Nausea for he is vomiting according to her while she was speaking to patient.  Thank you

## 2021-10-18 ENCOUNTER — Other Ambulatory Visit: Payer: Self-pay

## 2021-10-18 ENCOUNTER — Emergency Department (HOSPITAL_COMMUNITY)
Admission: EM | Admit: 2021-10-18 | Discharge: 2021-10-18 | Disposition: A | Discharge status: Home / Self Care | Payer: Medicaid Other | Attending: Emergency Medicine | Admitting: Emergency Medicine

## 2021-10-18 ENCOUNTER — Encounter (HOSPITAL_COMMUNITY): Payer: Self-pay | Admitting: Emergency Medicine

## 2021-10-18 ENCOUNTER — Emergency Department (HOSPITAL_COMMUNITY): Payer: Medicaid Other

## 2021-10-18 DIAGNOSIS — R109 Unspecified abdominal pain: Secondary | ICD-10-CM | POA: Diagnosis present

## 2021-10-18 DIAGNOSIS — D72829 Elevated white blood cell count, unspecified: Secondary | ICD-10-CM | POA: Insufficient documentation

## 2021-10-18 DIAGNOSIS — R6883 Chills (without fever): Secondary | ICD-10-CM | POA: Diagnosis not present

## 2021-10-18 DIAGNOSIS — R61 Generalized hyperhidrosis: Secondary | ICD-10-CM | POA: Insufficient documentation

## 2021-10-18 DIAGNOSIS — R112 Nausea with vomiting, unspecified: Secondary | ICD-10-CM | POA: Insufficient documentation

## 2021-10-18 DIAGNOSIS — R319 Hematuria, unspecified: Secondary | ICD-10-CM

## 2021-10-18 DIAGNOSIS — Z87891 Personal history of nicotine dependence: Secondary | ICD-10-CM | POA: Diagnosis not present

## 2021-10-18 DIAGNOSIS — R1084 Generalized abdominal pain: Secondary | ICD-10-CM | POA: Insufficient documentation

## 2021-10-18 LAB — CBC
HCT: 42.6 % (ref 39.0–52.0)
Hemoglobin: 14.1 g/dL (ref 13.0–17.0)
MCH: 28.8 pg (ref 26.0–34.0)
MCHC: 33.1 g/dL (ref 30.0–36.0)
MCV: 86.9 fL (ref 80.0–100.0)
Platelets: 284 10*3/uL (ref 150–400)
RBC: 4.9 MIL/uL (ref 4.22–5.81)
RDW: 13.6 % (ref 11.5–15.5)
WBC: 11.4 10*3/uL — ABNORMAL HIGH (ref 4.0–10.5)
nRBC: 0 % (ref 0.0–0.2)

## 2021-10-18 LAB — COMPREHENSIVE METABOLIC PANEL
ALT: 11 U/L (ref 0–44)
AST: 16 U/L (ref 15–41)
Albumin: 3.9 g/dL (ref 3.5–5.0)
Alkaline Phosphatase: 46 U/L (ref 38–126)
Anion gap: 9 (ref 5–15)
BUN: 6 mg/dL (ref 6–20)
CO2: 20 mmol/L — ABNORMAL LOW (ref 22–32)
Calcium: 9 mg/dL (ref 8.9–10.3)
Chloride: 110 mmol/L (ref 98–111)
Creatinine, Ser: 0.88 mg/dL (ref 0.61–1.24)
GFR, Estimated: >60 mL/min (ref >60)
Glucose, Bld: 113 mg/dL — ABNORMAL HIGH (ref 70–99)
Potassium: 3.9 mmol/L (ref 3.5–5.1)
Sodium: 139 mmol/L (ref 135–145)
Total Bilirubin: 0.7 mg/dL (ref 0.3–1.2)
Total Protein: 6.7 g/dL (ref 6.5–8.1)

## 2021-10-18 LAB — URINALYSIS, ROUTINE W REFLEX MICROSCOPIC
Bacteria, UA: NONE SEEN
Bilirubin Urine: NEGATIVE
Glucose, UA: NEGATIVE mg/dL
Ketones, ur: NEGATIVE mg/dL
Leukocytes,Ua: NEGATIVE
Nitrite: NEGATIVE
Protein, ur: NEGATIVE mg/dL
Specific Gravity, Urine: 1.006 (ref 1.005–1.030)
pH: 7 (ref 5.0–8.0)

## 2021-10-18 LAB — LIPASE, BLOOD: Lipase: 37 U/L (ref 11–51)

## 2021-10-18 MED ORDER — SODIUM CHLORIDE 0.9 % IV BOLUS
2000.0000 mL | Freq: Once | INTRAVENOUS | Status: AC
  Administered 2021-10-18: 2000 mL via INTRAVENOUS

## 2021-10-18 MED ORDER — ALUM & MAG HYDROXIDE-SIMETH 200-200-20 MG/5ML PO SUSP
30.0000 mL | Freq: Once | ORAL | Status: AC
Start: 2021-10-18 — End: 2021-10-18
  Administered 2021-10-18: 30 mL via ORAL
  Filled 2021-10-18: qty 30

## 2021-10-18 MED ORDER — FENTANYL CITRATE PF 50 MCG/ML IJ SOSY
50.0000 ug | PREFILLED_SYRINGE | Freq: Once | INTRAMUSCULAR | Status: AC
  Administered 2021-10-18: 50 ug via INTRAVENOUS
  Filled 2021-10-18: qty 1

## 2021-10-18 MED ORDER — DICYCLOMINE HCL 20 MG PO TABS: 20.0000 mg | ORAL_TABLET | Freq: Two times a day (BID) | ORAL | 0 refills | Status: DC

## 2021-10-18 MED ORDER — FAMOTIDINE 20 MG PO TABS: 20.0000 mg | ORAL_TABLET | Freq: Two times a day (BID) | ORAL | 0 refills | Status: DC

## 2021-10-18 MED ORDER — IOHEXOL 300 MG/ML  SOLN
100.0000 mL | Freq: Once | INTRAMUSCULAR | Status: AC | PRN
  Administered 2021-10-18: 100 mL via INTRAVENOUS

## 2021-10-18 MED ORDER — DICYCLOMINE HCL 10 MG PO CAPS
20.0000 mg | ORAL_CAPSULE | Freq: Once | ORAL | Status: AC
  Administered 2021-10-18: 20 mg via ORAL
  Filled 2021-10-18: qty 2

## 2021-10-18 MED ORDER — LIDOCAINE VISCOUS HCL 2 % MT SOLN
15.0000 mL | Freq: Once | OROMUCOSAL | Status: AC
Start: 2021-10-18 — End: 2021-10-18
  Administered 2021-10-18: 15 mL via ORAL
  Filled 2021-10-18: qty 15

## 2021-10-18 MED ORDER — METOCLOPRAMIDE HCL 5 MG/ML IJ SOLN
10.0000 mg | Freq: Once | INTRAMUSCULAR | Status: AC
  Administered 2021-10-18: 10 mg via INTRAVENOUS
  Filled 2021-10-18: qty 2

## 2021-10-18 MED ORDER — FAMOTIDINE IN NACL 20-0.9 MG/50ML-% IV SOLN
20.0000 mg | Freq: Once | INTRAVENOUS | Status: AC
  Administered 2021-10-18: 20 mg via INTRAVENOUS
  Filled 2021-10-18: qty 50

## 2021-10-18 MED ORDER — ONDANSETRON 4 MG PO TBDP
4.0000 mg | ORAL_TABLET | Freq: Three times a day (TID) | ORAL | 0 refills | Status: DC | PRN
Start: 2021-10-18 — End: 2021-12-15

## 2021-10-18 MED ORDER — HALOPERIDOL LACTATE 5 MG/ML IJ SOLN
5.0000 mg | Freq: Once | INTRAMUSCULAR | Status: AC
  Administered 2021-10-18: 5 mg via INTRAVENOUS
  Filled 2021-10-18: qty 1

## 2021-10-18 NOTE — ED Provider Notes (Signed)
Accepted handoff at shift change from [facility]. Please see prior provider note for more detail.  ? ?Briefly: Patient is 36 y.o.  ? ? ?Patient brought in by EMS for nausea vomiting diarrhea abdominal pain since yesterday.  Has had reassuring work-up including CT abdomen pelvis with contrast and labs that are unremarkable. ? ? ?Plan: P.o. challenge, plan is to discharge home after ? ? ? ? ? ?Physical Exam  ?BP 124/77 (BP Location: Left Arm)   Pulse 64   Temp 98 ?F (36.7 ?C)   Resp 20   SpO2 97%  ? ?Physical Exam ? ?Procedures  ?Procedures ? ?Results for orders placed or performed during the hospital encounter of 10/18/21  ?Lipase, blood  ?Result Value Ref Range  ? Lipase 37 11 - 51 U/L  ?Comprehensive metabolic panel  ?Result Value Ref Range  ? Sodium 139 135 - 145 mmol/L  ? Potassium 3.9 3.5 - 5.1 mmol/L  ? Chloride 110 98 - 111 mmol/L  ? CO2 20 (L) 22 - 32 mmol/L  ? Glucose, Bld 113 (H) 70 - 99 mg/dL  ? BUN 6 6 - 20 mg/dL  ? Creatinine, Ser 0.88 0.61 - 1.24 mg/dL  ? Calcium 9.0 8.9 - 10.3 mg/dL  ? Total Protein 6.7 6.5 - 8.1 g/dL  ? Albumin 3.9 3.5 - 5.0 g/dL  ? AST 16 15 - 41 U/L  ? ALT 11 0 - 44 U/L  ? Alkaline Phosphatase 46 38 - 126 U/L  ? Total Bilirubin 0.7 0.3 - 1.2 mg/dL  ? GFR, Estimated >60 >60 mL/min  ? Anion gap 9 5 - 15  ?CBC  ?Result Value Ref Range  ? WBC 11.4 (H) 4.0 - 10.5 K/uL  ? RBC 4.90 4.22 - 5.81 MIL/uL  ? Hemoglobin 14.1 13.0 - 17.0 g/dL  ? HCT 42.6 39.0 - 52.0 %  ? MCV 86.9 80.0 - 100.0 fL  ? MCH 28.8 26.0 - 34.0 pg  ? MCHC 33.1 30.0 - 36.0 g/dL  ? RDW 13.6 11.5 - 15.5 %  ? Platelets 284 150 - 400 K/uL  ? nRBC 0.0 0.0 - 0.2 %  ?Urinalysis, Routine w reflex microscopic Urine, Clean Catch  ?Result Value Ref Range  ? Color, Urine STRAW (A) YELLOW  ? APPearance CLEAR CLEAR  ? Specific Gravity, Urine 1.006 1.005 - 1.030  ? pH 7.0 5.0 - 8.0  ? Glucose, UA NEGATIVE NEGATIVE mg/dL  ? Hgb urine dipstick MODERATE (A) NEGATIVE  ? Bilirubin Urine NEGATIVE NEGATIVE  ? Ketones, ur  NEGATIVE NEGATIVE mg/dL  ? Protein, ur NEGATIVE NEGATIVE mg/dL  ? Nitrite NEGATIVE NEGATIVE  ? Leukocytes,Ua NEGATIVE NEGATIVE  ? RBC / HPF 0-5 0 - 5 RBC/hpf  ? WBC, UA 0-5 0 - 5 WBC/hpf  ? Bacteria, UA NONE SEEN NONE SEEN  ? Squamous Epithelial / LPF 0-5 0 - 5  ? Mucus PRESENT   ? ?CT Abdomen Pelvis W Contrast ? ?Result Date: 10/18/2021 ?CLINICAL DATA:  Lower abdominal pain with nausea vomiting and diarrhea. EXAM: CT ABDOMEN AND PELVIS WITH CONTRAST TECHNIQUE: Multidetector CT imaging of the abdomen and pelvis was performed using the standard protocol following bolus administration of intravenous contrast. RADIATION DOSE REDUCTION: This exam was performed according to the departmental dose-optimization program which includes automated exposure control, adjustment of the mA and/or kV according to patient size and/or use of iterative reconstruction technique. CONTRAST:  OMNIPAQUE IOHEXOL 300 MG/ML  SOLN COMPARISON:  CT stone study 05/11/2019 FINDINGS: Lower chest:  Unremarkable. Hepatobiliary: No suspicious focal abnormality within the liver parenchyma. There is no evidence for gallstones, gallbladder wall thickening, or pericholecystic fluid. No intrahepatic or extrahepatic biliary dilation. Pancreas: No focal mass lesion. No dilatation of the main duct. No intraparenchymal cyst. No peripancreatic edema. Spleen: No splenomegaly. No focal mass lesion. Adrenals/Urinary Tract: No adrenal nodule or mass. Kidneys unremarkable. No evidence for hydroureter. The urinary bladder appears normal for the degree of distention. Stomach/Bowel: Stomach is unremarkable. No gastric wall thickening. No evidence of outlet obstruction. Duodenum is normally positioned as is the ligament of Treitz. No small bowel wall thickening. No small bowel dilatation. The terminal ileum is normal. The appendix is normal. No gross colonic mass. No colonic wall thickening. Vascular/Lymphatic: No abdominal aortic aneurysm. No abdominal aortic  atherosclerotic calcification. Duplicated IVC evident. There is no gastrohepatic or hepatoduodenal ligament lymphadenopathy. No retroperitoneal or mesenteric lymphadenopathy. No pelvic sidewall lymphadenopathy. Reproductive: The prostate gland and seminal vesicles are unremarkable. Other: No intraperitoneal free fluid. Musculoskeletal: No worrisome lytic or sclerotic osseous abnormality. IMPRESSION: 1. No acute findings in the abdomen or pelvis. Specifically, no findings to explain the patient's history of lower abdominal pain with nausea and vomiting. Electronically Signed   By: Kennith Center M.D.   On: 10/18/2021 05:52   ?  ? ?ED Course / MDM  ?  ?Medical Decision Making ?Amount and/or Complexity of Data Reviewed ?Labs: ordered. ?Radiology: ordered. ? ?Risk ?OTC drugs. ?Prescription drug management. ? ? ?Patient is tolerating p.o., has received fluids, antiemetics, Bentyl Pepcid and analgesia. ?Discussed with patient possibility of cannabinoid hyperemesis.  He states that he does smoke regularly.  I recommended he consider cessation and informed him that it takes several months for this to improve. ? ? ?Tolerating p.o., ambulatory at time of discharge.  Vital signs have normalized and tachycardia has resolved. ? ?  ?Solon Augusta [place], [place] ?10/18/21 2080 ? ?  ?Cheryll Cockayne, MD ?10/29/21 [phone] ? ?

## 2021-10-18 NOTE — Discharge Instructions (Addendum)
Please drink plenty of water, Gatorade, Pedialyte ? ?Recommend bland foods including bananas applesauce toast RICE rice cakes ? ?Take Zofran every 8 hours for the next 24 hours after this use as needed.  Take Pepcid as prescribed and Bentyl for pain. ? ?Please follow-up with a primary care provider if you do not have 1 please follow-up with the [facility] was information I have given you.  Notably you were found to have a small amount of blood in your urine.  You should have this lab repeated with PCP ?

## 2021-10-18 NOTE — ED Notes (Signed)
Pt given water, crackers and apple sauce. Will circle back to check on pt.  ?

## 2021-10-18 NOTE — ED Provider Notes (Signed)
?[facility] EMERGENCY DEPARTMENT ?Provider Note ? ? ?CSN: [id number] ?Arrival date & time: 10/18/21  0057 ? ?  ? ?History ? ?Chief Complaint  ?Patient presents with  ? Abdominal Pain  ? ? ?Lawrence Fischer is a 36 y.o. male with a history of tobacco use and intractable cyclical N/V who presents to the ED with complaints of abdominal pain since yesterday. Constant, worse with vomiting, eating, and BM, no alleviating factors. Severe in nature. Associated sweats, chills, nausea, and TNTC episodes of emesis & diarrhea. Denies fever, hematemesis, melena, or hematochezia. Smokes marijuana last smoked a few days ago. Denies recent foreign travel or abx. Patient received zofran w/ ems, temporary improvement, then vomited in the waiting room.  ? ?HPI ? ?  ? ?Home Medications ?Prior to Admission medications   ?Medication Sig Start Date End Date Taking? Authorizing Provider  ?famotidine (PEPCID) 20 MG tablet Take 1 tablet (20 mg total) by mouth 2 (two) times daily. 07/20/19   Caccavale, Sophia, PA-C  ?HYDROcodone-acetaminophen (NORCO/VICODIN) 5-325 MG tablet Take 1-2 tablets by mouth every 6 (six) hours as needed for moderate pain or severe pain. 12/11/19   Domenick Gong, MD  ?ibuprofen (ADVIL) 600 MG tablet Take 1 tablet (600 mg total) by mouth every 6 (six) hours as needed. 07/15/20   Dartha Lodge, PA-C  ?lidocaine (XYLOCAINE) 2 % solution Use as directed 10 mLs in the mouth or throat every 6 (six) hours as needed for mouth pain. Hold in mouth and spit. Do not swallow. 12/11/19   Domenick Gong, MD  ?ondansetron (ZOFRAN ODT) 4 MG disintegrating tablet 4mg  ODT q4 hours prn nausea/vomit 07/15/20   07/17/20, PA-C  ?prochlorperazine (COMPAZINE) 10 MG tablet Take 1 tablet (10 mg total) by mouth 2 (two) times daily as needed for nausea or vomiting. 07/20/19   Caccavale, Sophia, PA-C  ?dicyclomine (BENTYL) 20 MG tablet Take 1 tablet (20 mg total) by mouth 3 (three) times daily as needed for  spasms. ?Patient not taking: Reported on 05/11/2019 12/01/18 05/11/19  05/13/19, MD  ?promethazine (PHENERGAN) 25 MG tablet Take 1 tablet (25 mg total) by mouth every 8 (eight) hours as needed for nausea or vomiting. ?Patient not taking: Reported on 05/11/2019 12/01/18 05/11/19  05/13/19, MD  ?   ? ?Allergies    ?Patient has no known allergies.   ? ?Review of Systems   ?Review of Systems  ?Constitutional:  Negative for chills and fever.  ?Respiratory:  Negative for shortness of breath.   ?Cardiovascular:  Negative for chest pain.  ?Gastrointestinal:  Positive for abdominal pain, diarrhea, nausea and vomiting. Negative for anal bleeding and blood in stool.  ?Genitourinary:  Negative for dysuria.  ?Neurological:  Negative for syncope.  ?All other systems reviewed and are negative. ? ?Physical Exam ?Updated Vital Signs ?BP (!) 91/58 (BP Location: Right Arm)   Pulse 94   Temp 98 ?F (36.7 ?C)   Resp 19   SpO2 100%  ?Physical Exam ?Vitals and nursing note reviewed.  ?Constitutional:   ?   Appearance: He is well-developed. He is not toxic-appearing.  ?   Comments: Patient intermittently moaning.   ?HENT:  ?   Head: Normocephalic and atraumatic.  ?Eyes:  ?   General:     ?   Right eye: No discharge.     ?   Left eye: No discharge.  ?   Conjunctiva/sclera: Conjunctivae normal.  ?Cardiovascular:  ?   Rate and Rhythm: Normal rate and  regular rhythm.  ?Pulmonary:  ?   Effort: No respiratory distress.  ?   Breath sounds: Normal breath sounds. No wheezing or rales.  ?Abdominal:  ?   General: There is no distension.  ?   Palpations: Abdomen is soft.  ?   Tenderness: There is generalized abdominal tenderness. There is no guarding or rebound.  ?Musculoskeletal:  ?   Cervical back: Neck supple.  ?Skin: ?   General: Skin is warm and dry.  ?Neurological:  ?   Mental Status: He is alert.  ?   Comments: Clear speech.   ?Psychiatric:     ?   Behavior: Behavior normal.  ? ? ?ED Results / Procedures / Treatments   ?Labs ?(all  labs ordered are listed, but only abnormal results are displayed) ?Labs Reviewed  ?COMPREHENSIVE METABOLIC PANEL - Abnormal; Notable for the following components:  ?    Result Value  ? CO2 20 (*)   ? Glucose, Bld 113 (*)   ? All other components within normal limits  ?CBC - Abnormal; Notable for the following components:  ? WBC 11.4 (*)   ? All other components within normal limits  ?LIPASE, BLOOD  ?URINALYSIS, ROUTINE W REFLEX MICROSCOPIC  ? ? ?EKG ?None ? ?Radiology ?CT Abdomen Pelvis W Contrast ? ?Result Date: 10/18/2021 ?CLINICAL DATA:  Lower abdominal pain with nausea vomiting and diarrhea. EXAM: CT ABDOMEN AND PELVIS WITH CONTRAST TECHNIQUE: Multidetector CT imaging of the abdomen and pelvis was performed using the standard protocol following bolus administration of intravenous contrast. RADIATION DOSE REDUCTION: This exam was performed according to the departmental dose-optimization program which includes automated exposure control, adjustment of the mA and/or kV according to patient size and/or use of iterative reconstruction technique. CONTRAST:  OMNIPAQUE IOHEXOL 300 MG/ML  SOLN COMPARISON:  CT stone study 05/11/2019 FINDINGS: Lower chest: Unremarkable. Hepatobiliary: No suspicious focal abnormality within the liver parenchyma. There is no evidence for gallstones, gallbladder wall thickening, or pericholecystic fluid. No intrahepatic or extrahepatic biliary dilation. Pancreas: No focal mass lesion. No dilatation of the main duct. No intraparenchymal cyst. No peripancreatic edema. Spleen: No splenomegaly. No focal mass lesion. Adrenals/Urinary Tract: No adrenal nodule or mass. Kidneys unremarkable. No evidence for hydroureter. The urinary bladder appears normal for the degree of distention. Stomach/Bowel: Stomach is unremarkable. No gastric wall thickening. No evidence of outlet obstruction. Duodenum is normally positioned as is the ligament of Treitz. No small bowel wall thickening. No small bowel  dilatation. The terminal ileum is normal. The appendix is normal. No gross colonic mass. No colonic wall thickening. Vascular/Lymphatic: No abdominal aortic aneurysm. No abdominal aortic atherosclerotic calcification. Duplicated IVC evident. There is no gastrohepatic or hepatoduodenal ligament lymphadenopathy. No retroperitoneal or mesenteric lymphadenopathy. No pelvic sidewall lymphadenopathy. Reproductive: The prostate gland and seminal vesicles are unremarkable. Other: No intraperitoneal free fluid. Musculoskeletal: No worrisome lytic or sclerotic osseous abnormality. IMPRESSION: 1. No acute findings in the abdomen or pelvis. Specifically, no findings to explain the patient's history of lower abdominal pain with nausea and vomiting. Electronically Signed   By: Kennith Center M.D.   On: 10/18/2021 05:52   ? ?Procedures ?Procedures  ? ? ?Medications Ordered in ED ?Medications  ?sodium chloride 0.9 % bolus 2,000 mL (has no administration in time range)  ?fentaNYL (SUBLIMAZE) injection 50 mcg (has no administration in time range)  ?metoCLOPramide (REGLAN) injection 10 mg (has no administration in time range)  ? ? ?ED Course/ Medical Decision Making/ A&P ?  ?                        ?  Medical Decision Making ?Amount and/or Complexity of Data Reviewed ?Labs: ordered. ?Radiology: ordered. ? ?Risk ?OTC drugs. ?Prescription drug management. ? ? ?Patient presents to the ED with complaints of abdominal pain w/ n/v/d, this involves an extensive number of treatment options, and is a complaint that carries with it a high risk of complications and morbidity. Nontoxic, vitals w/ soft BP- improved w/ repeat vitals.   ? ?Ddx including but not limited to: viral gastroenteritis, appendicitis, perf, obstruction, diverticulitis, cholecystitis, pancreatitis.  ? ?Additional history obtained:  ?Chart & nursing note reviewed.  ?External records reviewed- prior admission for intractable vomiting.  ? ?Lab Tests:  ?I viewed & interpreted labs  including:  ?CBC: mild leukocytosis ?CMP: no critical electrolyte derangement.  ?Lipase: WNL ?UA: hgb present- PCP follow up.  ? ?Plan for fluids, analgesics, antiemetics, and CT a/p.  ? ?Imaging Studies:  ?I ordered

## 2021-10-18 NOTE — ED Triage Notes (Signed)
Pt BIB EMS from home with abdominal pain, n/v/d.  Pain started yesterday.  Dull, lower abdomen.  Chills. No fever recorded.  20 G Lhand.  900 NS and 4 zofran en route.  ?

## 2021-12-14 ENCOUNTER — Other Ambulatory Visit: Payer: Self-pay

## 2021-12-14 ENCOUNTER — Encounter (HOSPITAL_COMMUNITY): Payer: Self-pay

## 2021-12-14 ENCOUNTER — Emergency Department (HOSPITAL_COMMUNITY)
Admission: EM | Admit: 2021-12-14 | Discharge: 2021-12-15 | Disposition: A | Discharge status: Home / Self Care | Payer: Medicaid Other | Attending: Emergency Medicine | Admitting: Emergency Medicine

## 2021-12-14 DIAGNOSIS — F17210 Nicotine dependence, cigarettes, uncomplicated: Secondary | ICD-10-CM | POA: Insufficient documentation

## 2021-12-14 DIAGNOSIS — M5442 Lumbago with sciatica, left side: Secondary | ICD-10-CM | POA: Insufficient documentation

## 2021-12-14 DIAGNOSIS — M545 Low back pain, unspecified: Secondary | ICD-10-CM | POA: Diagnosis present

## 2021-12-14 DIAGNOSIS — X500XXA Overexertion from strenuous movement or load, initial encounter: Secondary | ICD-10-CM | POA: Insufficient documentation

## 2021-12-14 NOTE — ED Provider Notes (Signed)
[facility]-EMERGENCY DEPT Provider Note: Lawrence Dell, MD, FACEP  CSN: [id number] MRN: [id number] ARRIVAL: 12/14/21 at 2258 ROOM: WA25/WA25   CHIEF COMPLAINT  Back Pain   HISTORY OF PRESENT ILLNESS  12/15/21 12:02 AM Arva Chafe Palladino is a 36 y.o. male    Past Medical History:  Diagnosis Date   Dehydration     Past Surgical History:  Procedure Laterality Date   KNEE SURGERY  2004    History reviewed. No pertinent family history.  Social History   Tobacco Use   Smoking status: Every Day    Packs/day: 0.50    Types: Cigarettes   Smokeless tobacco: Never  Vaping Use   Vaping Use: Never used  Substance Use Topics   Alcohol use: Never   Drug use: Yes    Types: Marijuana    Prior to Admission medications   Medication Sig Start Date End Date Taking? Authorizing Provider  Aspirin-Acetaminophen-Caffeine (GOODY HEADACHE PO) Take 1 packet by mouth 2 (two) times daily as needed (headache).    [provider]  Bismuth Subsalicylate (PEPTO-BISMOL PO) Take 2 tablets by mouth daily as needed (nausea).    [provider]  dicyclomine (BENTYL) 20 MG tablet Take 1 tablet (20 mg total) by mouth 2 (two) times daily. 10/18/21   Gailen Shelter, PA  dimenhyDRINATE (DRAMAMINE PO) Take 1-2 tablets by mouth daily as needed (nausea).    [provider]  famotidine (PEPCID) 20 MG tablet Take 1 tablet (20 mg total) by mouth 2 (two) times daily. Patient not taking: Reported on 10/18/2021 07/20/19   Caccavale, Sophia, PA-C  famotidine (PEPCID) 20 MG tablet Take 1 tablet (20 mg total) by mouth 2 (two) times daily. 10/18/21   Gailen Shelter, PA  lidocaine 4 % Place 1-2 patches onto the skin daily as needed (Back pain).    [provider]  ondansetron (ZOFRAN-ODT) 4 MG disintegrating tablet Take 1 tablet (4 mg total) by mouth every 8 (eight) hours as needed for nausea or vomiting. 10/18/21   Gailen Shelter, PA  promethazine (PHENERGAN) 25 MG tablet Take 1  tablet (25 mg total) by mouth every 8 (eight) hours as needed for nausea or vomiting. Patient not taking: Reported on 05/11/2019 12/01/18 05/11/19  Tilden Fossa, MD    Allergies Patient has no known allergies.   REVIEW OF SYSTEMS  Negative except as noted here or in the History of Present Illness.   PHYSICAL EXAMINATION  Initial Vital Signs Blood pressure 129/86, pulse 83, temperature 98 F (36.7 C), temperature source Oral, resp. rate 20, height 5\' 10"  (1.778 m), weight 83.9 kg, SpO2 98 %.  Examination General: Well-developed, well-nourished male in no acute distress; appearance consistent with age of record HENT: normocephalic; atraumatic Eyes: pupils equal, round and reactive to light; extraocular muscles intact Neck: supple Heart: regular rate and rhythm; no murmurs, rubs or gallops Lungs: clear to auscultation bilaterally Abdomen: soft; nondistended; nontender; no masses or hepatosplenomegaly; bowel sounds present Extremities: No deformity; full range of motion; pulses normal Neurologic: Awake, alert and oriented; motor function intact in all extremities and symmetric; no facial droop Skin: Warm and dry Psychiatric: Normal mood and affect   RESULTS  Summary of this visit's results, reviewed and interpreted by myself:   EKG Interpretation  Date/Time:    Ventricular Rate:    PR Interval:    QRS Duration:   QT Interval:    QTC Calculation:   R Axis:     Text Interpretation:  Laboratory Studies: No results found for this or any previous visit (from the past 24 hour(s)). Imaging Studies: No results found.  ED COURSE and MDM  Nursing notes, initial and subsequent vitals signs, including pulse oximetry, reviewed and interpreted by myself.  Vitals:   12/14/21 2302  BP: 129/86  Pulse: 83  Resp: 20  Temp: 98 F (36.7 C)  TempSrc: Oral  SpO2: 98%  Weight: 83.9 kg  Height: 5\' 10"  (1.778 m)   Medications - No data to display    PROCEDURES   Procedures   ED DIAGNOSES  No diagnosis found.

## 2021-12-14 NOTE — ED Triage Notes (Signed)
Pt reports picking up a 4 x 8 ft piece of steel today, injuring his upper and lower back.

## 2021-12-15 ENCOUNTER — Emergency Department (HOSPITAL_COMMUNITY): Payer: Medicaid Other

## 2021-12-15 MED ORDER — CYCLOBENZAPRINE HCL 10 MG PO TABS
10.0000 mg | ORAL_TABLET | Freq: Once | ORAL | Status: AC
  Administered 2021-12-15: 10 mg via ORAL
  Filled 2021-12-15: qty 1

## 2021-12-15 MED ORDER — ONDANSETRON 8 MG PO TBDP
8.0000 mg | ORAL_TABLET | Freq: Once | ORAL | Status: AC
  Administered 2021-12-15: 8 mg via ORAL
  Filled 2021-12-15: qty 1

## 2021-12-15 MED ORDER — HYDROMORPHONE HCL 2 MG/ML IJ SOLN
2.0000 mg | Freq: Once | INTRAMUSCULAR | Status: AC
  Administered 2021-12-15: 2 mg via INTRAMUSCULAR
  Filled 2021-12-15: qty 1

## 2021-12-15 MED ORDER — CYCLOBENZAPRINE HCL 10 MG PO TABS: 10.0000 mg | ORAL_TABLET | Freq: Three times a day (TID) | ORAL | 0 refills | Status: DC | PRN

## 2021-12-15 MED ORDER — HYDROMORPHONE HCL 2 MG PO TABS: 2.0000 mg | ORAL_TABLET | ORAL | 0 refills | Status: DC | PRN

## 2021-12-15 NOTE — ED Notes (Signed)
Meds given. Pt ready for ct

## 2022-01-30 ENCOUNTER — Other Ambulatory Visit: Payer: Self-pay

## 2022-01-30 ENCOUNTER — Encounter (HOSPITAL_COMMUNITY): Payer: Self-pay | Admitting: *Deleted

## 2022-01-30 ENCOUNTER — Emergency Department (HOSPITAL_COMMUNITY)
Admission: EM | Admit: 2022-01-30 | Discharge: 2022-01-30 | Disposition: A | Discharge status: Home / Self Care | Payer: Medicaid Other | Attending: Emergency Medicine | Admitting: Emergency Medicine

## 2022-01-30 DIAGNOSIS — M545 Low back pain, unspecified: Secondary | ICD-10-CM | POA: Diagnosis present

## 2022-01-30 DIAGNOSIS — M5442 Lumbago with sciatica, left side: Secondary | ICD-10-CM | POA: Diagnosis not present

## 2022-01-30 DIAGNOSIS — Z7982 Long term (current) use of aspirin: Secondary | ICD-10-CM | POA: Diagnosis not present

## 2022-01-30 DIAGNOSIS — M5432 Sciatica, left side: Secondary | ICD-10-CM

## 2022-01-30 MED ORDER — ACETAMINOPHEN 500 MG PO TABS
1000.0000 mg | ORAL_TABLET | Freq: Once | ORAL | Status: AC
  Administered 2022-01-30: 1000 mg via ORAL
  Filled 2022-01-30: qty 2

## 2022-01-30 MED ORDER — DIAZEPAM 5 MG PO TABS
5.0000 mg | ORAL_TABLET | Freq: Once | ORAL | Status: AC
Start: 2022-01-30 — End: 2022-01-30
  Administered 2022-01-30: 5 mg via ORAL
  Filled 2022-01-30: qty 1

## 2022-01-30 MED ORDER — LIDOCAINE 5 % EX PTCH
1.0000 | MEDICATED_PATCH | Freq: Once | CUTANEOUS | Status: DC
  Administered 2022-01-30: 1 via TRANSDERMAL
  Filled 2022-01-30: qty 1

## 2022-01-30 MED ORDER — HYDROCODONE-ACETAMINOPHEN 5-325 MG PO TABS: 1.0000 | ORAL_TABLET | Freq: Four times a day (QID) | ORAL | 0 refills | Status: DC | PRN

## 2022-01-30 MED ORDER — HYDROMORPHONE HCL 1 MG/ML IJ SOLN: 1.0000 mg | Freq: Once | INTRAMUSCULAR | Status: DC

## 2022-01-30 MED ORDER — METHYLPREDNISOLONE 4 MG PO TBPK: ORAL_TABLET | ORAL | 0 refills | Status: DC

## 2022-01-30 MED ORDER — KETOROLAC TROMETHAMINE 30 MG/ML IJ SOLN
30.0000 mg | Freq: Once | INTRAMUSCULAR | Status: AC
  Administered 2022-01-30: 30 mg via INTRAMUSCULAR
  Filled 2022-01-30: qty 1

## 2022-01-30 NOTE — ED Provider Notes (Signed)
[facility]-EMERGENCY DEPT Provider Note   CSN: [id number] Arrival date & time: 01/30/22  1238     History  Chief Complaint  Patient presents with   Back Pain   Leg Pain    Sciatic left side pain    Lawrence Fischer is a 36 y.o. male.  Patient is a 36 year old male with a past medical history of sciatica presenting to the emergency department with exacerbation of his sciatica pain.  Patient states that he was lifting his son earlier today when he had sudden severe left-sided low back pain radiating down his left leg.  He states that this feels similar to his sciatica in the past.  He states that he has tingling diffusely in the left leg.  He denies any saddle anesthesia, loss of bowel or bladder function.  He denies any falls or trauma to his back.  He denies any fevers or chills.  States he has not taken anything for his pain at home.  He states in the past when he has received Toradol he has had significant relief.   Back Pain Associated symptoms: leg pain   Leg Pain Associated symptoms: back pain        Home Medications Prior to Admission medications   Medication Sig Start Date End Date Taking? Authorizing Provider  Aspirin-Acetaminophen-Caffeine (GOODY HEADACHE PO) Take 1 packet by mouth 2 (two) times daily as needed (headache).   Yes [provider]  Menthol, Topical Analgesic, (BIOFREEZE COOL THE PAIN) 4 % GEL Apply 1 Application topically daily as needed (pain).   Yes [provider]  methylPREDNISolone (MEDROL DOSEPAK) 4 MG TBPK tablet Day 1: 8 mg PO before breakfast, 4 mg after lunch and after dinner, and 8 mg at bedtime Day 2: 4 mg PO before breakfast, after lunch, and after dinner and 8 mg at bedtime Day 3: 4 mg PO before breakfast, after lunch, after dinner, and at bedtime Day 4: 4 mg PO before breakfast, after lunch, and at bedtime Day 5: 4 mg PO before breakfast and at bedtime Day 6: 4 mg PO before breakfast 01/30/22  Yes  Tyron Manetta K, DO  cyclobenzaprine (FLEXERIL) 10 MG tablet Take 1 tablet (10 mg total) by mouth 3 (three) times daily as needed for muscle spasms. Patient not taking: Reported on 01/30/2022 12/15/21   Molpus, Jonny Ruiz, MD  HYDROmorphone (DILAUDID) 2 MG tablet Take 1 tablet (2 mg total) by mouth every 4 (four) hours as needed for severe pain. Patient not taking: Reported on 01/30/2022 12/15/21   Molpus, Jonny Ruiz, MD  promethazine (PHENERGAN) 25 MG tablet Take 1 tablet (25 mg total) by mouth every 8 (eight) hours as needed for nausea or vomiting. Patient not taking: Reported on 05/11/2019 12/01/18 05/11/19  Tilden Fossa, MD      Allergies    Patient has no known allergies.    Review of Systems   Review of Systems  Musculoskeletal:  Positive for back pain.    Physical Exam Updated Vital Signs BP 116/83   Pulse (!) 58   Temp (!) 97.4 F (36.3 C) (Oral)   Resp 16   Ht 5\' 10"  (1.778 m)   Wt 83.9 kg   SpO2 96%   BMI 26.54 kg/m  Physical Exam Vitals and nursing note reviewed.  Constitutional:      Comments: Uncomfortable appearing laying on his right side  HENT:     Head: Normocephalic and atraumatic.     Nose: Nose normal.  Mouth/Throat:     Mouth: Mucous membranes are moist.  Eyes:     Extraocular Movements: Extraocular movements intact.     Conjunctiva/sclera: Conjunctivae normal.  Neck:     Comments: No midline neck tenderness Cardiovascular:     Rate and Rhythm: Normal rate and regular rhythm.  Pulmonary:     Effort: Pulmonary effort is normal.     Breath sounds: Normal breath sounds.  Abdominal:     General: Abdomen is flat.     Palpations: Abdomen is soft.  Musculoskeletal:     Cervical back: Normal range of motion and neck supple.     Comments: No midline back tenderness, left-sided lumbar paraspinal muscle tenderness to palpation with palpable muscle spasm.  Positive straight leg raise bilaterally with radiation down his left leg.  No bony tenderness to palpation in  bilateral lower extremities  Skin:    General: Skin is warm and dry.  Neurological:     General: No focal deficit present.     Mental Status: He is alert and oriented to person, place, and time.     Comments: Strength 5 out of 5 in bilateral upper and lower extremities, sensation intact in bilateral upper and lower extremities, though subjective paresthesias in left lower extremity  Psychiatric:        Mood and Affect: Mood normal.        Behavior: Behavior normal.     ED Results / Procedures / Treatments   Labs (all labs ordered are listed, but only abnormal results are displayed) Labs Reviewed - No data to display  EKG None  Radiology No results found.  Procedures Procedures    Medications Ordered in ED Medications  lidocaine (LIDODERM) 5 % 1-3 patch (1 patch Transdermal Patch Applied 01/30/22 1558)  ketorolac (TORADOL) 30 MG/ML injection 30 mg (30 mg Intramuscular Given 01/30/22 1317)  diazepam (VALIUM) tablet 5 mg (5 mg Oral Given 01/30/22 1557)  acetaminophen (TYLENOL) tablet 1,000 mg (1,000 mg Oral Given 01/30/22 1557)    ED Course/ Medical Decision Making/ A&P Clinical Course as of 01/30/22 1807  Sun Jan 30, 2022  1750 Upon reassessment, the patient reports improvement of his pain but states it is now not completely resolved.  Patient was offered opioid analgesia but states that he has had previous bad effects and would prefer to avoid opioids.  Patient will be given a steroid burst, Tylenol and Motrin and primary care to follow-up with.  He is able to sit up and ambulate in the room steadily.  He is stable for discharge.  His questions were answered and he was given strict return precautions. [VK]    Clinical Course User Index [VK] Phoebe Sharps, DO                           Medical Decision Making Patient is a 36 year old male with a past medical history of sciatica presenting to the emergency department with exacerbation of his sciatica pain.  Patient has no  midline neck or back tenderness, no saddle anesthesia, no focal neurologic deficits, no trauma, no fevers, making cauda equina, epidural abscess or spinal fracture unlikely.  Patient does have positive straight leg raise, making symptoms consistent with his known sciatica.  He was given a dose of Toradol in triage and had no significant relief.  Patient reports that he did not drive himself here and was able to get a ride home safely, and will be given a  dose of Valium as well as Tylenol and lidocaine patch and will be reassessed.  Risk OTC drugs. Prescription drug management.           Final Clinical Impression(s) / ED Diagnoses Final diagnoses:  Sciatica of left side    Rx / DC Orders ED Discharge Orders          Ordered    methylPREDNISolone (MEDROL DOSEPAK) 4 MG TBPK tablet        01/30/22 1754              Teisha Trowbridge, [place] K, DO 01/30/22 1807

## 2022-01-30 NOTE — Discharge Instructions (Addendum)
You were seen in the emergency department for your back pain.  This is likely an exacerbation of your sciatica.  We gave you Toradol which is a shot version similar to Motrin as well as Tylenol and a muscle relaxer called Valium.  He did have some improvement of your pain and you will likely feel sore in the next few days.  You can continue to take Tylenol and Motrin up to every 6 hours as needed for pain.  I have also given you a steroid burst pack that you should take to help with the inflammation around your nerve.  You can use the lidocaine patches and in between you can use ice or heat.  You should try to stretch her back to prevent it from getting more stiff.  You should follow-up with the primary care clinic to have your symptoms rechecked and if you are having continued pain he may benefit from physical therapy.  You should return to the emergency department if you develop numbness or weakness in your leg, you are unable to walk, you have numbness of your groin, you cannot control your bowels or your bladder, or if you have any other new or concerning symptoms.

## 2022-01-30 NOTE — ED Triage Notes (Signed)
Patient reports lower back pain that goes down to his leg that began a couple of hours ago.  Patient states that this is feels like sciatic pain that he's had before.  Patient was carrying son when his son "dropped his weight" and cause the pain to began.  Pt has no taken anything to relieve the pain.

## 2022-01-30 NOTE — ED Provider Triage Note (Signed)
Emergency Medicine Provider Triage Evaluation Note  Lawrence Fischer , a 36 y.o. male  was evaluated in triage.  Pt complains of low back pain, no direct trauma, triggered by catching child today. Neuro intact. Hx of prior.  Review of Systems  Positive: backpain Negative: Weakness, bladder/bowel incontenince, retention  Physical Exam  BP 109/79   Pulse 69   Temp 97.9 F (36.6 C) (Oral)   Resp 16   Ht 5\' 10"  (1.778 m)   Wt 83.9 kg   SpO2 100%   BMI 26.54 kg/m  Gen:   Awake, no distress   Resp:  Normal effort  MSK:   Moves extremities without difficulty  Other:  TTP in lumbar region, 5/5 strength and normal sensation in LLE and RLE  Medical Decision Making  Medically screening exam initiated at 1:10 PM.  Appropriate orders placed.  Stauber was informed that the remainder of the evaluation will be completed by another provider, this initial triage assessment does not replace that evaluation, and the importance of remaining in the ED until their evaluation is complete.  Pain control, reassess   Oneal Deputy, MD 01/30/22 1311

## 2022-02-16 ENCOUNTER — Emergency Department (HOSPITAL_COMMUNITY)
Admission: EM | Admit: 2022-02-16 | Discharge: 2022-02-16 | Disposition: A | Discharge status: Home / Self Care | Payer: Medicaid Other | Attending: Emergency Medicine | Admitting: Emergency Medicine

## 2022-02-16 ENCOUNTER — Emergency Department (HOSPITAL_COMMUNITY): Payer: Medicaid Other

## 2022-02-16 ENCOUNTER — Other Ambulatory Visit: Payer: Self-pay

## 2022-02-16 ENCOUNTER — Encounter (HOSPITAL_COMMUNITY): Payer: Self-pay

## 2022-02-16 DIAGNOSIS — R1084 Generalized abdominal pain: Secondary | ICD-10-CM | POA: Insufficient documentation

## 2022-02-16 DIAGNOSIS — D72829 Elevated white blood cell count, unspecified: Secondary | ICD-10-CM | POA: Diagnosis not present

## 2022-02-16 DIAGNOSIS — R197 Diarrhea, unspecified: Secondary | ICD-10-CM | POA: Diagnosis not present

## 2022-02-16 DIAGNOSIS — R112 Nausea with vomiting, unspecified: Secondary | ICD-10-CM | POA: Insufficient documentation

## 2022-02-16 DIAGNOSIS — R109 Unspecified abdominal pain: Secondary | ICD-10-CM

## 2022-02-16 LAB — CBC
HCT: 45.5 % (ref 39.0–52.0)
Hemoglobin: 15.1 g/dL (ref 13.0–17.0)
MCH: 29.3 pg (ref 26.0–34.0)
MCHC: 33.2 g/dL (ref 30.0–36.0)
MCV: 88.2 fL (ref 80.0–100.0)
Platelets: 296 10*3/uL (ref 150–400)
RBC: 5.16 MIL/uL (ref 4.22–5.81)
RDW: 13.9 % (ref 11.5–15.5)
WBC: 19 10*3/uL — ABNORMAL HIGH (ref 4.0–10.5)
nRBC: 0 % (ref 0.0–0.2)

## 2022-02-16 LAB — COMPREHENSIVE METABOLIC PANEL
ALT: 15 U/L (ref 0–44)
AST: 15 U/L (ref 15–41)
Albumin: 4.5 g/dL (ref 3.5–5.0)
Alkaline Phosphatase: 46 U/L (ref 38–126)
Anion gap: 12 (ref 5–15)
BUN: 12 mg/dL (ref 6–20)
CO2: 21 mmol/L — ABNORMAL LOW (ref 22–32)
Calcium: 9.7 mg/dL (ref 8.9–10.3)
Chloride: 108 mmol/L (ref 98–111)
Creatinine, Ser: 0.96 mg/dL (ref 0.61–1.24)
GFR, Estimated: >60 mL/min (ref >60)
Glucose, Bld: 121 mg/dL — ABNORMAL HIGH (ref 70–99)
Potassium: 3.6 mmol/L (ref 3.5–5.1)
Sodium: 141 mmol/L (ref 135–145)
Total Bilirubin: 1.2 mg/dL (ref 0.3–1.2)
Total Protein: 7.4 g/dL (ref 6.5–8.1)

## 2022-02-16 LAB — LIPASE, BLOOD: Lipase: 24 U/L (ref 11–51)

## 2022-02-16 MED ORDER — KETOROLAC TROMETHAMINE 30 MG/ML IJ SOLN
30.0000 mg | Freq: Once | INTRAMUSCULAR | Status: AC
  Administered 2022-02-16: 30 mg via INTRAVENOUS
  Filled 2022-02-16: qty 1

## 2022-02-16 MED ORDER — ONDANSETRON HCL 4 MG/2ML IJ SOLN
4.0000 mg | Freq: Once | INTRAMUSCULAR | Status: AC
  Administered 2022-02-16: 4 mg via INTRAVENOUS
  Filled 2022-02-16: qty 2

## 2022-02-16 MED ORDER — ONDANSETRON 8 MG PO TBDP: 8.0000 mg | ORAL_TABLET | Freq: Three times a day (TID) | ORAL | 0 refills | Status: DC | PRN

## 2022-02-16 MED ORDER — IOHEXOL 300 MG/ML  SOLN
100.0000 mL | Freq: Once | INTRAMUSCULAR | Status: AC | PRN
  Administered 2022-02-16: 100 mL via INTRAVENOUS

## 2022-02-16 MED ORDER — ONDANSETRON 8 MG PO TBDP
8.0000 mg | ORAL_TABLET | Freq: Once | ORAL | Status: AC
Start: 2022-02-16 — End: 2022-02-16
  Administered 2022-02-16: 8 mg via ORAL
  Filled 2022-02-16: qty 1

## 2022-02-16 MED ORDER — SODIUM CHLORIDE 0.9 % IV BOLUS (SEPSIS)
1000.0000 mL | Freq: Once | INTRAVENOUS | Status: AC
  Administered 2022-02-16: 1000 mL via INTRAVENOUS

## 2022-02-16 MED ORDER — SODIUM CHLORIDE 0.9 % IV SOLN
1000.0000 mL | INTRAVENOUS | Status: DC
  Administered 2022-02-16: 1000 mL via INTRAVENOUS

## 2022-02-16 NOTE — ED Provider Triage Note (Signed)
Emergency Medicine Provider Triage Evaluation Note  Lawrence Fischer , a 36 y.o. male  was evaluated in triage.  Pt complains of abdominal pain associated with nausea, and vomiting onset today.  Denies history of the same secondary to dehydration.  Denies fever, chills.  Review of Systems  Positive: As above Negative: As above  Physical Exam  BP (!) 105/90 (BP Location: Right Arm)   Pulse (!) 55   Temp 97.8 F (36.6 C) (Oral)   Resp 18   SpO2 99%  Gen:   Awake, no distress   Resp:  Normal effort  MSK:   Moves extremities without difficulty  Other:    Medical Decision Making  Medically screening exam initiated at 3:28 PM.  Appropriate orders placed.  Lawrence Fischer was informed that the remainder of the evaluation will be completed by another provider, this initial triage assessment does not replace that evaluation, and the importance of remaining in the ED until their evaluation is complete.     Lawrence [place], PA-C 02/16/22 1529

## 2022-02-16 NOTE — ED Provider Notes (Signed)
[facility]-EMERGENCY DEPT Provider Note   CSN: [id number] Arrival date & time: 02/16/22  1350     History  Chief Complaint  Patient presents with  . Abdominal Pain    Churchill Grimsley Tegtmeyer is a 36 y.o. male.   Abdominal Pain    Patient states he has history of recurrent episodes of dehydration nausea vomiting.  States he started having trouble with vomiting abdominal pain the last couple of days.  Patient Denies any dysuria.  He feels extremely dehydrated.  Is not having any fevers or chills.  Denies any prior surgeries  Home Medications Prior to Admission medications   Medication Sig Start Date End Date Taking? Authorizing Provider  Aspirin-Acetaminophen-Caffeine (GOODY HEADACHE PO) Take 1 packet by mouth 2 (two) times daily as needed (headache).   Yes [provider]  Menthol, Topical Analgesic, (BIOFREEZE COOL THE PAIN) 4 % GEL Apply 1 Application topically daily as needed (pain).   Yes [provider]  ondansetron (ZOFRAN-ODT) 8 MG disintegrating tablet Take 1 tablet (8 mg total) by mouth every 8 (eight) hours as needed for nausea or vomiting. 02/16/22  Yes Linwood Dibbles, MD  cyclobenzaprine (FLEXERIL) 10 MG tablet Take 1 tablet (10 mg total) by mouth 3 (three) times daily as needed for muscle spasms. Patient not taking: Reported on 01/30/2022 12/15/21   Molpus, Jonny Ruiz, MD  HYDROcodone-acetaminophen (NORCO/VICODIN) 5-325 MG tablet Take 1 tablet by mouth every 6 (six) hours as needed for up to 3 doses. Patient not taking: Reported on 02/16/2022 01/30/22   Arturo Morton K, DO  HYDROmorphone (DILAUDID) 2 MG tablet Take 1 tablet (2 mg total) by mouth every 4 (four) hours as needed for severe pain. Patient not taking: Reported on 01/30/2022 12/15/21   Molpus, Jonny Ruiz, MD  methylPREDNISolone (MEDROL DOSEPAK) 4 MG TBPK tablet Day 1: 8 mg PO before breakfast, 4 mg after lunch and after dinner, and 8 mg at bedtime Day 2: 4 mg PO before breakfast, after lunch,  and after dinner and 8 mg at bedtime Day 3: 4 mg PO before breakfast, after lunch, after dinner, and at bedtime Day 4: 4 mg PO before breakfast, after lunch, and at bedtime Day 5: 4 mg PO before breakfast and at bedtime Day 6: 4 mg PO before breakfast Patient not taking: Reported on 02/16/2022 01/30/22   Arturo Morton K, DO  promethazine (PHENERGAN) 25 MG tablet Take 1 tablet (25 mg total) by mouth every 8 (eight) hours as needed for nausea or vomiting. Patient not taking: Reported on 05/11/2019 12/01/18 05/11/19  Tilden Fossa, MD      Allergies    Patient has no known allergies.    Review of Systems   Review of Systems  Gastrointestinal:  Positive for abdominal pain.    Physical Exam Updated Vital Signs BP 128/69 (BP Location: Right Arm)   Pulse 64   Temp 98.7 F (37.1 C) (Oral)   Resp 18   SpO2 100%  Physical Exam Vitals and nursing note reviewed.  Constitutional:      Appearance: He is well-developed. He is ill-appearing.  HENT:     Head: Normocephalic and atraumatic.     Right Ear: External ear normal.     Left Ear: External ear normal.  Eyes:     General: No scleral icterus.       Right eye: No discharge.        Left eye: No discharge.     Conjunctiva/sclera: Conjunctivae normal.  Neck:  Trachea: No tracheal deviation.  Cardiovascular:     Rate and Rhythm: Normal rate and regular rhythm.  Pulmonary:     Effort: Pulmonary effort is normal. No respiratory distress.     Breath sounds: Normal breath sounds. No stridor. No wheezing or rales.  Abdominal:     General: Bowel sounds are normal. There is no distension.     Palpations: Abdomen is soft.     Tenderness: There is abdominal tenderness in the epigastric area. There is no guarding or rebound.  Musculoskeletal:        General: No tenderness or deformity.     Cervical back: Neck supple.  Skin:    General: Skin is warm and dry.     Findings: No rash.  Neurological:     General: No focal deficit present.      Mental Status: He is alert.     Cranial Nerves: No cranial nerve deficit (no facial droop, extraocular movements intact, no slurred speech).     Sensory: No sensory deficit.     Motor: No abnormal muscle tone or seizure activity.     Coordination: Coordination normal.  Psychiatric:        Mood and Affect: Mood normal.     ED Results / Procedures / Treatments   Labs (all labs ordered are listed, but only abnormal results are displayed) Labs Reviewed  COMPREHENSIVE METABOLIC PANEL - Abnormal; Notable for the following components:      Result Value   CO2 21 (*)    Glucose, Bld 121 (*)    All other components within normal limits  CBC - Abnormal; Notable for the following components:   WBC 19.0 (*)    All other components within normal limits  LIPASE, BLOOD  URINALYSIS, ROUTINE W REFLEX MICROSCOPIC    EKG None  Radiology CT ABDOMEN PELVIS W CONTRAST  Addendum Date: 02/16/2022   ADDENDUM REPORT: 02/16/2022 20:16 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   On: 02/16/2022 20:16   Result Date: 02/16/2022 CLINICAL DATA:  Abdomen pain nausea vomiting diarrhea EXAM: CT ABDOMEN AND PELVIS WITH CONTRAST TECHNIQUE: Multidetector CT imaging of the abdomen and pelvis was performed using the standard protocol following bolus administration of intravenous contrast. RADIATION DOSE REDUCTION: This exam was performed according to the departmental dose-optimization program which includes automated exposure control, adjustment of the mA and/or kV according to patient size and/or use of iterative reconstruction technique. CONTRAST:  OMNIPAQUE IOHEXOL 300 MG/ML  SOLN COMPARISON:  CT 10/18/2021 FINDINGS: Lower chest: No acute abnormality. Hepatobiliary: No focal liver abnormality is seen. No gallstones, gallbladder wall thickening, or biliary dilatation. Pancreas: Unremarkable. No pancreatic ductal dilatation or surrounding inflammatory changes. Spleen:  Normal in size without focal abnormality. Adrenals/Urinary Tract: Adrenal glands are normal. Kidneys show no hydronephrosis. Subcentimeter hypodensities in the left kidney too small to further characterize, no follow-up imaging is recommended. The bladder is unremarkable Stomach/Bowel: The stomach is nonenlarged. No dilated small bowel. Negative appendix. Decompressed appearance of the colon without definitive inflammation. Vascular/Lymphatic: No significant vascular findings are present. No enlarged abdominal or pelvic lymph nodes. Reproductive: Status post hysterectomy. No adnexal masses. Other: No abdominal wall hernia or abnormality. No abdominopelvic ascites. Musculoskeletal: No acute or significant osseous findings. IMPRESSION: No CT evidence for acute intra-abdominal or pelvic abnormality. Electronically Signed: By: Jasmine Pang M.D. On: 02/16/2022 20:10    Procedures Procedures    Medications Ordered in ED Medications  sodium chloride 0.9 % bolus  1,000 mL (1,000 mLs Intravenous New Bag/Given 02/16/22 1937)    Followed by  sodium chloride 0.9 % bolus 1,000 mL (1,000 mLs Intravenous New Bag/Given 02/16/22 2000)    Followed by  0.9 %  sodium chloride infusion (1,000 mLs Intravenous New Bag/Given 02/16/22 2030)  ondansetron (ZOFRAN) injection 4 mg (has no administration in time range)  ondansetron (ZOFRAN-ODT) disintegrating tablet 8 mg (8 mg Oral Given 02/16/22 1534)  ondansetron (ZOFRAN) injection 4 mg (4 mg Intravenous Given 02/16/22 1930)  iohexol (OMNIPAQUE) 300 MG/ML solution 100 mL (100 mLs Intravenous Contrast Given 02/16/22 1953)  ketorolac (TORADOL) 30 MG/ML injection 30 mg (30 mg Intravenous Given 02/16/22 2203)    ED Course/ Medical Decision Making/ A&P Clinical Course as of 02/16/22 2219  Wed Feb 16, 2022  1919 CBC(!) Significant leukocytosis [JK]  1919 Comprehensive metabolic panel(!) No signs of severe dehydration [JK]  1919 Lipase, blood Normal [JK]  2013 CT scan without  acute findings [JK]  2217 Patient unable to give urine sample.  He does request 1 more dose of Zofran but has not had any further nausea vomiting.  Patient states he does not want a wait for the urine sample because his ride is here.  He denies any urinary symptoms [JK]    Clinical Course User Index [JK] Dorie Rank, MD                           Medical Decision Making Amount and/or Complexity of Data Reviewed Labs: ordered. Decision-making details documented in ED Course. Radiology: ordered.  Risk Prescription drug management.   Patient presented to the ED for evaluation of abdominal pain vomiting and some diarrhea but primarily vomiting.  Patient was also having diffuse abdominal pain.  Concerned about the possibility of hepatitis pancreatitis cholecystitis appendicitis with his diffuse discomfort.  He did have a significant leukocytosis.  Metabolic panel unremarkable otherwise.  Lipase without signs of pancreatitis.  CT scan was performed and no acute findings were noted on the CT scan.  Patient was treated with IV antiemetics and IV fluids.  He was feeling somewhat better there and did not have any further vomiting.  Possible viral etiology.  He was able to tolerate fluids here in the ED.  I discussed with the patient that I want to check urinalysis to make sure he did not have any signs of infection.  Patient did not want to wait.  He denies any dysuria.  Warning signs and precautions discussed.  Evaluation and diagnostic testing in the emergency department does not suggest an emergent condition requiring admission or immediate intervention beyond what has been performed at this time.  The patient is safe for discharge and has been instructed to return immediately for worsening symptoms, change in symptoms or any other concerns.          Final Clinical Impression(s) / ED Diagnoses Final diagnoses:  Nausea and vomiting, unspecified vomiting type  Leukocytosis, unspecified type   Abdominal pain, unspecified abdominal location    Rx / DC Orders ED Discharge Orders          Ordered    ondansetron (ZOFRAN-ODT) 8 MG disintegrating tablet  Every 8 hours PRN        02/16/22 2213              Dorie Rank, MD 02/16/22 2219

## 2022-02-16 NOTE — Discharge Instructions (Signed)
Blood test showed your white count was very elevated.  We were unable to check a urine sample today.  I do recommend that you get rechecked if your symptoms have not resolved in the next day or 2.  Otherwise follow-up with a primary care doctor to recheck your blood tests and urine test.  Take medications as needed for nausea.  Return at anytime for recurrent or persistent symptoms.

## 2022-02-16 NOTE — ED Triage Notes (Signed)
Pt reports abdominal pain and N/V/D that began today. Pt reports he normally has these symptoms due to dehydration.

## 2022-07-06 IMAGING — CT CT L SPINE W/O CM
3 series · 11 of 33 positions shown, 13 images · non-contrast
Comparison: No prior CT of the lumbar spine, correlation is made
with 10/18/2021 CT abdomen pelvis.

CLINICAL DATA: Back pain



[Series 5: l spine st · axial · 0.32mm/px · z∈[-240,-74]mm · 3 of 135 slices shown, 4 images]
[im 31/135  soft-tissue]
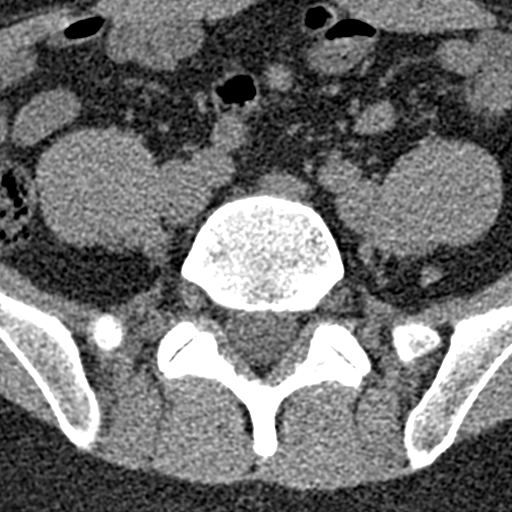
[im 31/135  bone]
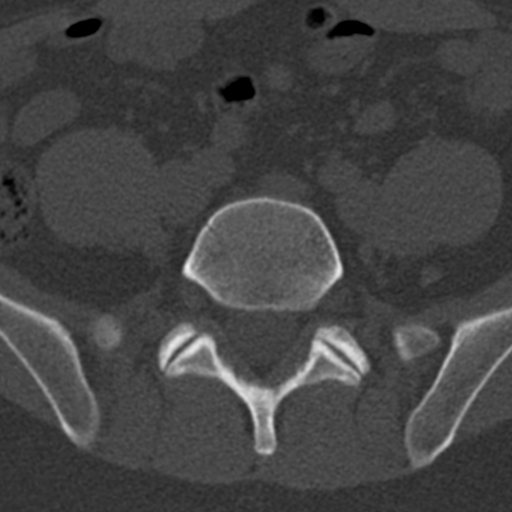
[im 73/135  bone]
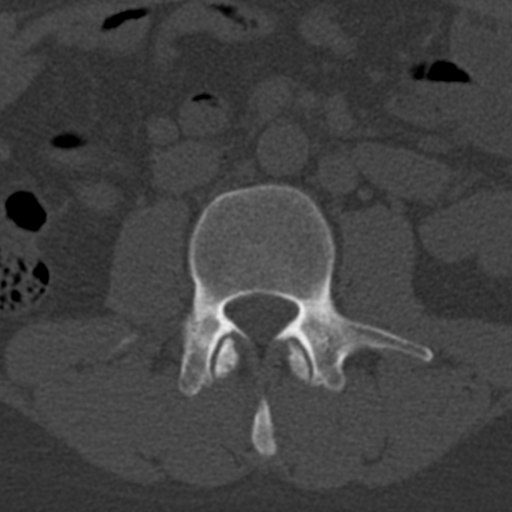
[im 114/135  bone]
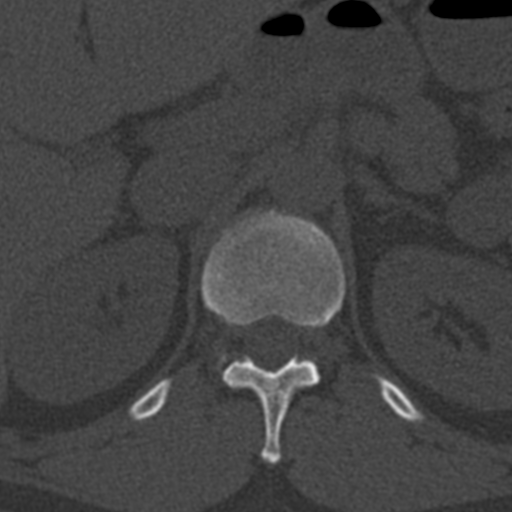

[Series 7: coronal bone · coronal · 0.39mm/px · 3 of 61 slices shown]
[im 13/61  bone]
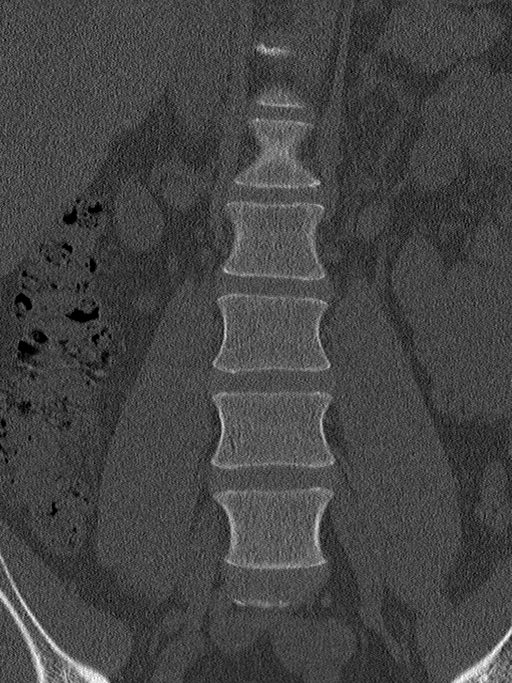
[im 25/61  bone]
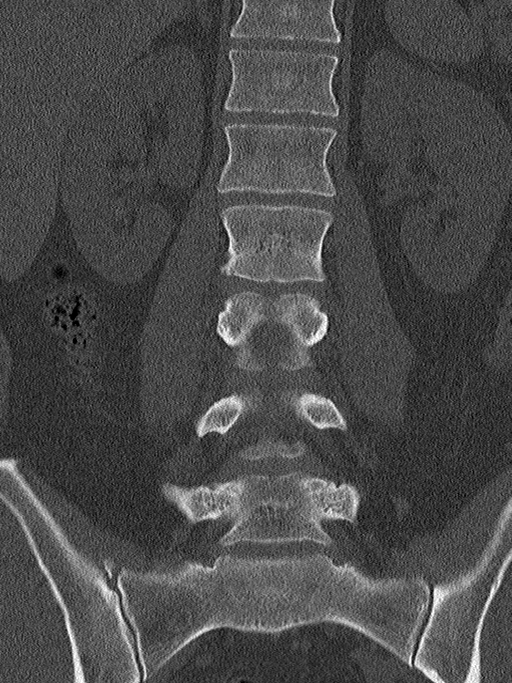
[im 37/61  bone]
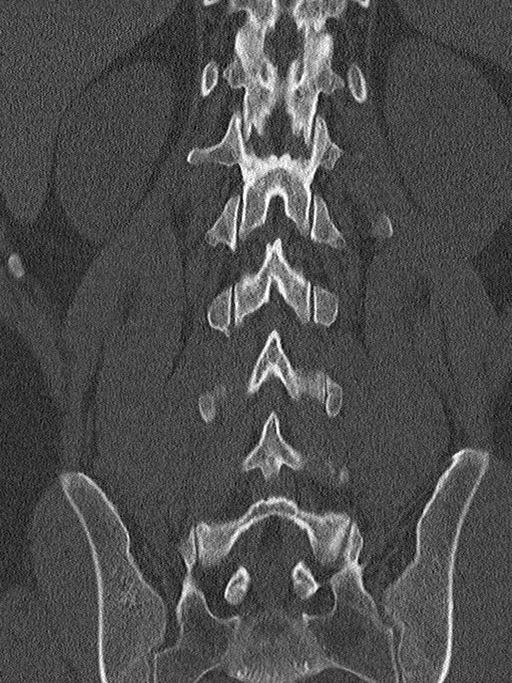

[Series 9: sagittal st · sagittal · 0.28mm/px · 5 of 94 slices shown, 6 images]
[im 32/94  bone]
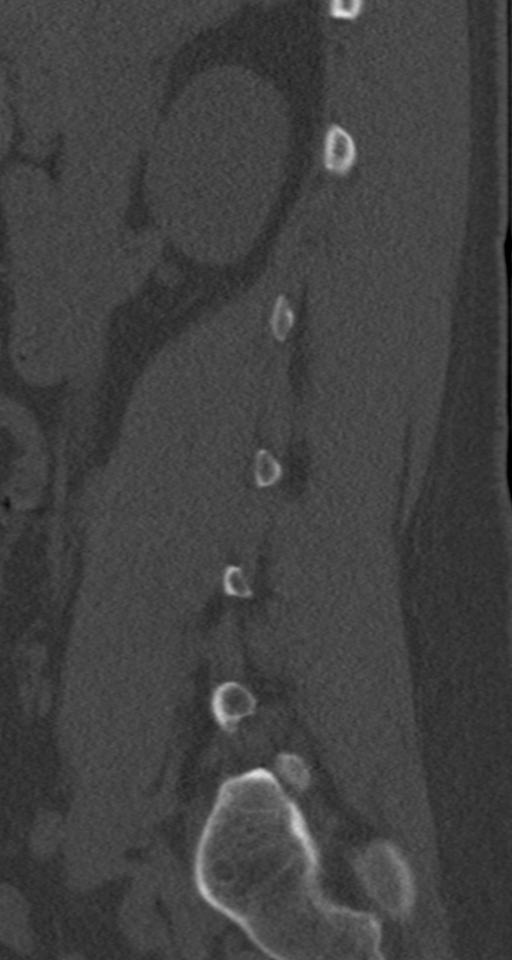
[im 39/94  bone]
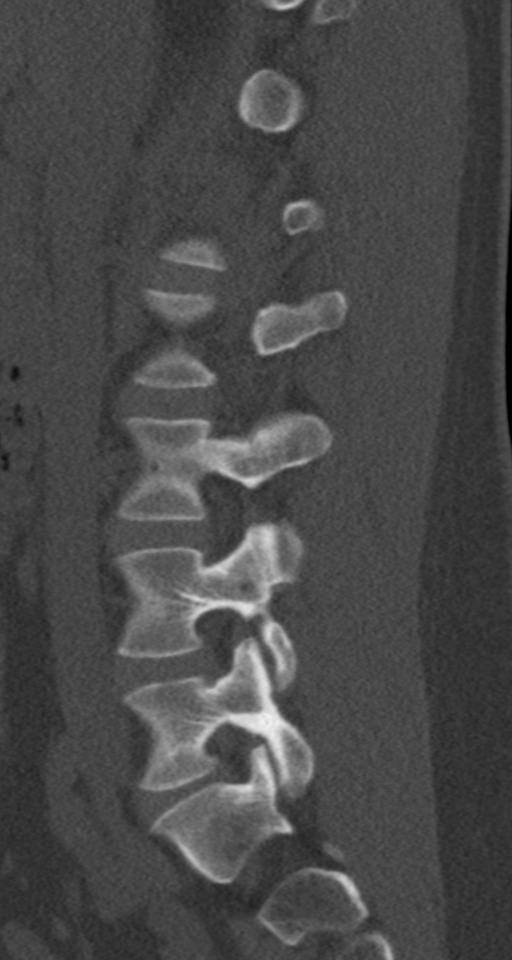
[im 47/94  soft-tissue]
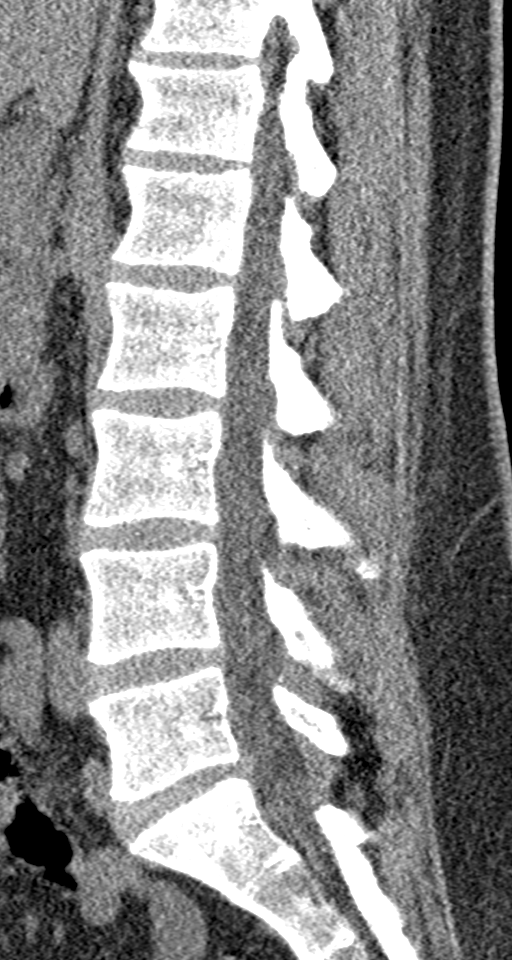
[im 47/94  bone]
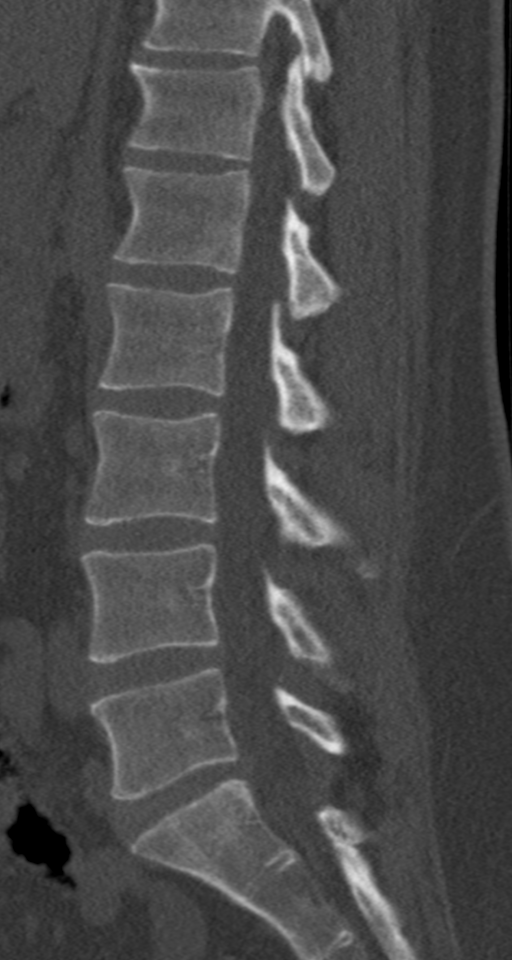
[im 55/94  bone]
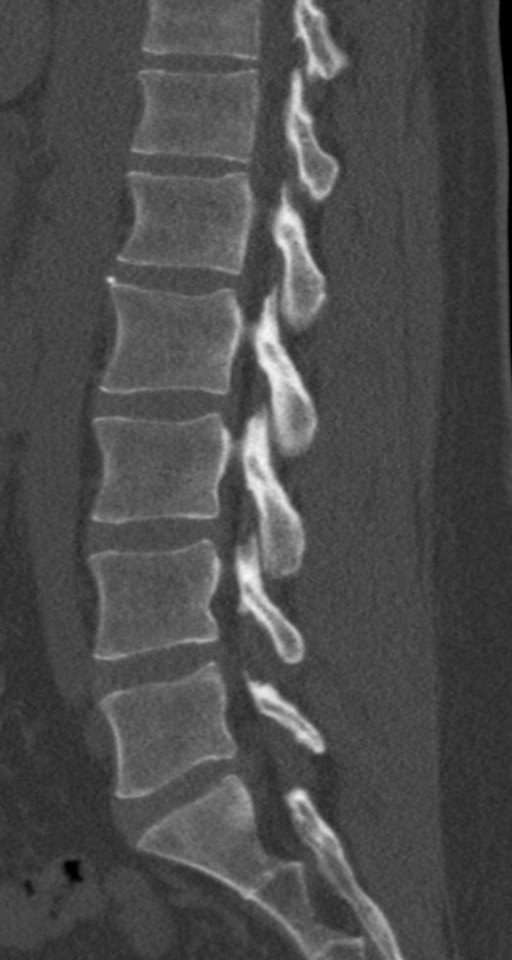
[im 63/94  bone]
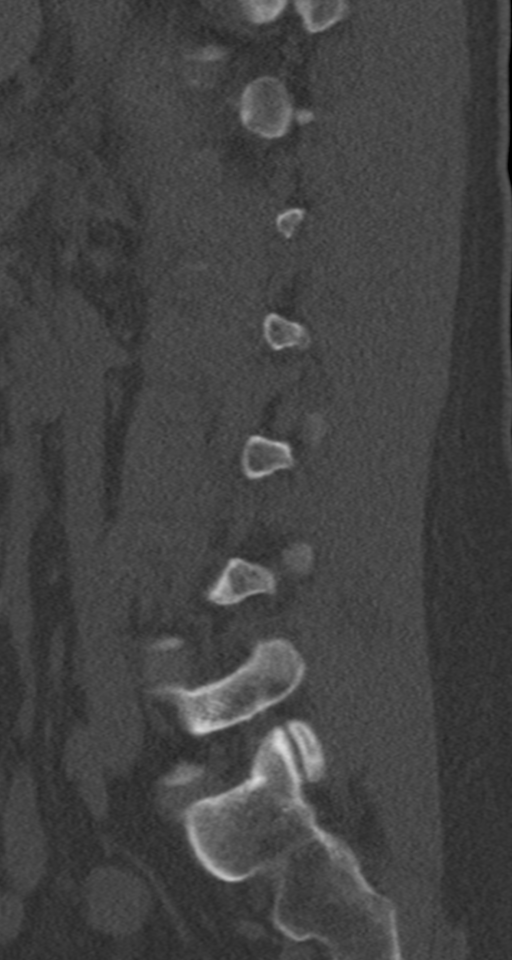

[11 of 33 positions shown; findings below may reference images not displayed]

FINDINGS: Segmentation: 5 lumbar-type vertebral bodies.

Alignment: Straightening of the normal lumbar lordosis. Mild
dextrocurvature. No listhesis.

Vertebrae: No acute fracture or suspicious osseous lesion. Vertebral
body heights are preserved.

Paraspinal and other soft tissues: No acute finding.

Disc levels: No significant degenerative changes. Disc heights are
preserved. No spinal canal stenosis or neural foraminal narrowing.
IMPRESSION: No acute fracture or traumatic listhesis. No spinal canal stenosis
or neural foraminal narrowing.

## 2022-07-26 ENCOUNTER — Emergency Department (HOSPITAL_COMMUNITY): Payer: Medicaid Other

## 2022-07-26 ENCOUNTER — Emergency Department (HOSPITAL_COMMUNITY)
Admission: EM | Admit: 2022-07-26 | Discharge: 2022-07-26 | Disposition: A | Discharge status: Home / Self Care | Payer: Medicaid Other | Attending: Emergency Medicine | Admitting: Emergency Medicine

## 2022-07-26 ENCOUNTER — Other Ambulatory Visit: Payer: Self-pay

## 2022-07-26 DIAGNOSIS — E872 Acidosis, unspecified: Secondary | ICD-10-CM | POA: Diagnosis not present

## 2022-07-26 DIAGNOSIS — Z1152 Encounter for screening for COVID-19: Secondary | ICD-10-CM | POA: Diagnosis not present

## 2022-07-26 DIAGNOSIS — R112 Nausea with vomiting, unspecified: Secondary | ICD-10-CM | POA: Insufficient documentation

## 2022-07-26 DIAGNOSIS — R197 Diarrhea, unspecified: Secondary | ICD-10-CM | POA: Insufficient documentation

## 2022-07-26 DIAGNOSIS — D72829 Elevated white blood cell count, unspecified: Secondary | ICD-10-CM | POA: Diagnosis not present

## 2022-07-26 DIAGNOSIS — R1084 Generalized abdominal pain: Secondary | ICD-10-CM

## 2022-07-26 LAB — CBC WITH DIFFERENTIAL/PLATELET
Abs Immature Granulocytes: 0.03 10*3/uL (ref 0.00–0.07)
Basophils Absolute: 0 10*3/uL (ref 0.0–0.1)
Basophils Relative: 0 %
Eosinophils Absolute: 0 10*3/uL (ref 0.0–0.5)
Eosinophils Relative: 0 %
HCT: 42.1 % (ref 39.0–52.0)
Hemoglobin: 14.3 g/dL (ref 13.0–17.0)
Immature Granulocytes: 0 %
Lymphocytes Relative: 18 %
Lymphs Abs: 1.9 10*3/uL (ref 0.7–4.0)
MCH: 28.9 pg (ref 26.0–34.0)
MCHC: 34 g/dL (ref 30.0–36.0)
MCV: 85.1 fL (ref 80.0–100.0)
Monocytes Absolute: 0.7 10*3/uL (ref 0.1–1.0)
Monocytes Relative: 7 %
Neutro Abs: 8.3 10*3/uL — ABNORMAL HIGH (ref 1.7–7.7)
Neutrophils Relative %: 75 %
Platelets: 285 10*3/uL (ref 150–400)
RBC: 4.95 MIL/uL (ref 4.22–5.81)
RDW: 13.6 % (ref 11.5–15.5)
WBC: 11 10*3/uL — ABNORMAL HIGH (ref 4.0–10.5)
nRBC: 0 % (ref 0.0–0.2)

## 2022-07-26 LAB — URINALYSIS, ROUTINE W REFLEX MICROSCOPIC
Bacteria, UA: NONE SEEN
Bilirubin Urine: NEGATIVE
Glucose, UA: NEGATIVE mg/dL
Ketones, ur: 80 mg/dL — AB
Leukocytes,Ua: NEGATIVE
Nitrite: NEGATIVE
Protein, ur: NEGATIVE mg/dL
Specific Gravity, Urine: >1.046 — ABNORMAL HIGH (ref 1.005–1.030)
pH: 6 (ref 5.0–8.0)

## 2022-07-26 LAB — COMPREHENSIVE METABOLIC PANEL
ALT: 13 U/L (ref 0–44)
AST: 17 U/L (ref 15–41)
Albumin: 4.2 g/dL (ref 3.5–5.0)
Alkaline Phosphatase: 41 U/L (ref 38–126)
Anion gap: 13 (ref 5–15)
BUN: 11 mg/dL (ref 6–20)
CO2: 21 mmol/L — ABNORMAL LOW (ref 22–32)
Calcium: 9.6 mg/dL (ref 8.9–10.3)
Chloride: 103 mmol/L (ref 98–111)
Creatinine, Ser: 1.04 mg/dL (ref 0.61–1.24)
GFR, Estimated: >60 mL/min (ref >60)
Glucose, Bld: 93 mg/dL (ref 70–99)
Potassium: 3.6 mmol/L (ref 3.5–5.1)
Sodium: 137 mmol/L (ref 135–145)
Total Bilirubin: 1.1 mg/dL (ref 0.3–1.2)
Total Protein: 7.3 g/dL (ref 6.5–8.1)

## 2022-07-26 LAB — RESP PANEL BY RT-PCR (RSV, FLU A&B, COVID)  RVPGX2
Influenza A by PCR: NEGATIVE
Influenza B by PCR: NEGATIVE
Resp Syncytial Virus by PCR: NEGATIVE
SARS Coronavirus 2 by RT PCR: NEGATIVE

## 2022-07-26 LAB — LIPASE, BLOOD: Lipase: 52 U/L — ABNORMAL HIGH (ref 11–51)

## 2022-07-26 MED ORDER — METOCLOPRAMIDE HCL 10 MG PO TABS: 10.0000 mg | ORAL_TABLET | Freq: Four times a day (QID) | ORAL | 0 refills | Status: AC

## 2022-07-26 MED ORDER — DICYCLOMINE HCL 20 MG PO TABS
20.0000 mg | ORAL_TABLET | Freq: Two times a day (BID) | ORAL | 0 refills | Status: AC | PRN
Start: 2022-07-26 — End: ?

## 2022-07-26 MED ORDER — IOHEXOL 350 MG/ML SOLN
75.0000 mL | Freq: Once | INTRAVENOUS | Status: AC | PRN
  Administered 2022-07-26: 75 mL via INTRAVENOUS

## 2022-07-26 MED ORDER — MORPHINE SULFATE (PF) 4 MG/ML IV SOLN
4.0000 mg | Freq: Once | INTRAVENOUS | Status: AC
  Administered 2022-07-26: 4 mg via INTRAVENOUS
  Filled 2022-07-26: qty 1

## 2022-07-26 MED ORDER — LACTATED RINGERS IV BOLUS
1000.0000 mL | Freq: Once | INTRAVENOUS | Status: AC
  Administered 2022-07-26: 1000 mL via INTRAVENOUS

## 2022-07-26 MED ORDER — DROPERIDOL 2.5 MG/ML IJ SOLN: 1.2500 mg | Freq: Once | INTRAMUSCULAR | Status: DC

## 2022-07-26 MED ORDER — DROPERIDOL 2.5 MG/ML IJ SOLN
2.5000 mg | Freq: Once | INTRAMUSCULAR | Status: AC
  Administered 2022-07-26: 2.5 mg via INTRAVENOUS
  Filled 2022-07-26: qty 2

## 2022-07-26 NOTE — ED Provider Triage Note (Signed)
Emergency Medicine Provider Triage Evaluation Note  Lawrence Fischer , a 37 y.o. male  was evaluated in triage.  Pt complains of severe diffuse abdominal pain, nausea, vomiting, and diarrhea for the last 24 hours.  Patient states that he has had constant vomiting and diarrhea since onset of the pain.  He has taken Zofran x 3 today with no relief of symptoms.  States that he has had the same symptoms in the past and it was attributed to marijuana use but he is no longer using marijuana and continues to have the symptoms.  He denies fever, chills, chest pain, shortness of breath, hematemesis, hematochezia, or melena.  He does state that he has some bilateral flank pain and is concerned for a kidney infection.  He denies a history of recurrent UTIs.  He denies a history of kidney disease or kidney stones.  Review of Systems  Positive: See HPI Negative: See HPI  Physical Exam  BP 111/66 (BP Location: Left Arm)   Pulse 98   Temp 98.5 F (36.9 C) (Oral)   Resp 18   SpO2 100%  Gen:   Awake, no distress   Resp:  Normal effort  MSK:   Moves extremities without difficulty  Other:  Abdomen soft, mild generalized tenderness, moderate epigastric and LUQ tenderness, bilateral CVA tenderness, no active vomiting  Medical Decision Making  Medically screening exam initiated at 4:26 PM.  Appropriate orders placed.  Laurance Flatten Rickel was informed that the remainder of the evaluation will be completed by another provider, this initial triage assessment does not replace that evaluation, and the importance of remaining in the ED until their evaluation is complete.     Suzzette Righter, PA-C 07/26/22 1629

## 2022-07-26 NOTE — ED Provider Notes (Signed)
[facility] Provider Note   CSN: [id number] Arrival date & time: 07/26/22  0539     History  Chief Complaint  Patient presents with   Abdominal Pain   Emesis    Lawrence Fischer is a 37 y.o. male.  With PMH of intractable nausea and vomiting who presents with diffuse generalized abdominal pain associate with nausea vomiting and diarrhea.  Patient said this happens every 6 to 8 months with no answer to why it happens.  He has been told in the past is due to smoking marijuana however he stopped smoking marijuana pretty recently and says it is still happening.  He complains of generalized achy abdominal pain associate with multiple episodes of nonbloody nonbilious vomiting with associated couple episodes of nonbloody diarrhea.  He has never seen a GI specialist but recently got insurance.  He denies any associated urinary symptoms.  No fevers, no cough, no congestion, no rhinorrhea.  He tried taking over-the-counter meds and meds that were previously prescribed without relief at home which led to him coming here.   Abdominal Pain Associated symptoms: vomiting   Emesis Associated symptoms: abdominal pain        Home Medications Prior to Admission medications   Medication Sig Start Date End Date Taking? Authorizing Provider  Aspirin-Acetaminophen-Caffeine (GOODY HEADACHE PO) Take 1 packet by mouth 2 (two) times daily as needed (headache).    [provider]  cyclobenzaprine (FLEXERIL) 10 MG tablet Take 1 tablet (10 mg total) by mouth 3 (three) times daily as needed for muscle spasms. Patient not taking: Reported on 01/30/2022 12/15/21   Molpus, Jenny Reichmann, MD  HYDROcodone-acetaminophen (NORCO/VICODIN) 5-325 MG tablet Take 1 tablet by mouth every 6 (six) hours as needed for up to 3 doses. Patient not taking: Reported on 02/16/2022 01/30/22   Leanord Asal K, DO  HYDROmorphone (DILAUDID) 2 MG tablet Take 1 tablet (2 mg total) by mouth  every 4 (four) hours as needed for severe pain. Patient not taking: Reported on 01/30/2022 12/15/21   Molpus, John, MD  Menthol, Topical Analgesic, (BIOFREEZE COOL THE PAIN) 4 % GEL Apply 1 Application topically daily as needed (pain).    [provider]  methylPREDNISolone (MEDROL DOSEPAK) 4 MG TBPK tablet Day 1: 8 mg PO before breakfast, 4 mg after lunch and after dinner, and 8 mg at bedtime Day 2: 4 mg PO before breakfast, after lunch, and after dinner and 8 mg at bedtime Day 3: 4 mg PO before breakfast, after lunch, after dinner, and at bedtime Day 4: 4 mg PO before breakfast, after lunch, and at bedtime Day 5: 4 mg PO before breakfast and at bedtime Day 6: 4 mg PO before breakfast Patient not taking: Reported on 02/16/2022 01/30/22   Leanord Asal K, DO  ondansetron (ZOFRAN-ODT) 8 MG disintegrating tablet Take 1 tablet (8 mg total) by mouth every 8 (eight) hours as needed for nausea or vomiting. 02/16/22   Dorie Rank, MD  promethazine (PHENERGAN) 25 MG tablet Take 1 tablet (25 mg total) by mouth every 8 (eight) hours as needed for nausea or vomiting. Patient not taking: Reported on 05/11/2019 12/01/18 05/11/19  Quintella Reichert, MD      Allergies    Patient has no known allergies.    Review of Systems   Review of Systems  Gastrointestinal:  Positive for abdominal pain and vomiting.    Physical Exam Updated Vital Signs BP 122/76   Pulse 88   Temp 98.5 F (  36.9 C) (Oral)   Resp 16   SpO2 100%  Physical Exam Constitutional: Alert and oriented.  Mildly fatigued in appearance but nontoxic no acute distress Eyes: Conjunctivae are normal. ENT      Neck: No stridor. Cardiovascular: S1, S2, regular rate Respiratory: Normal respiratory effort.  O2 sat 100 on RA Gastrointestinal: Nondistended, flat, no rebound, no guarding, no localizing tenderness, generalized tenderness Musculoskeletal: Normal range of motion in all extremities. Neurologic: Normal speech and language. No gross  focal neurologic deficits are appreciated. Skin: Skin is warm, dry and intact. No rash noted. Psychiatric: Mood and affect are normal. Speech and behavior are normal.  ED Results / Procedures / Treatments   Labs (all labs ordered are listed, but only abnormal results are displayed) Labs Reviewed  CBC WITH DIFFERENTIAL/PLATELET - Abnormal; Notable for the following components:      Result Value   WBC 11.0 (*)    Neutro Abs 8.3 (*)    All other components within normal limits  COMPREHENSIVE METABOLIC PANEL - Abnormal; Notable for the following components:   CO2 21 (*)    All other components within normal limits  LIPASE, BLOOD - Abnormal; Notable for the following components:   Lipase 52 (*)    All other components within normal limits  RESP PANEL BY RT-PCR (RSV, FLU A&B, COVID)  [id number]  URINALYSIS, ROUTINE W REFLEX MICROSCOPIC    EKG None  Radiology CT Abdomen Pelvis W Contrast  Result Date: 07/26/2022 CLINICAL DATA:  Acute abdominal pain EXAM: CT ABDOMEN AND PELVIS WITH CONTRAST TECHNIQUE: Multidetector CT imaging of the abdomen and pelvis was performed using the standard protocol following bolus administration of intravenous contrast. RADIATION DOSE REDUCTION: This exam was performed according to the departmental dose-optimization program which includes automated exposure control, adjustment of the mA and/or kV according to patient size and/or use of iterative reconstruction technique. CONTRAST:  13mL OMNIPAQUE IOHEXOL 350 MG/ML SOLN COMPARISON:  02/16/2022 FINDINGS: Lower chest: No acute abnormality. Hepatobiliary: No focal liver abnormality is seen. No gallstones, gallbladder wall thickening, or biliary dilatation. Pancreas: Unremarkable. No pancreatic ductal dilatation or surrounding inflammatory changes. Spleen: Normal in size without focal abnormality. Adrenals/Urinary Tract: Adrenal glands are within normal limits. Kidneys demonstrate a normal enhancement pattern bilaterally.  Subcentimeter cyst is noted within the left kidney. No follow-up is recommended. No renal calculi or obstructive changes are seen. The bladder is partially distended. Stomach/Bowel: Mild diverticular change of the colon is noted without evidence of diverticulitis. No obstructive or inflammatory changes of the colon are seen. The appendix is within normal limits. Small bowel and stomach are within normal limits. Vascular/Lymphatic: No significant vascular findings are present. No enlarged abdominal or pelvic lymph nodes. Reproductive: Prostate is unremarkable. Other: No abdominal wall hernia or abnormality. No abdominopelvic ascites. Musculoskeletal: No acute or significant osseous findings. IMPRESSION: Mild diverticular change of the colon is noted. No acute abnormality to correspond with the given clinical history is noted. Electronically Signed   By: Inez Catalina M.D.   On: 07/26/2022 18:51    Procedures Procedures    Medications Ordered in ED Medications  lactated ringers bolus 1,000 mL (1,000 mLs Intravenous New Bag/Given 07/26/22 1847)  morphine (PF) 4 MG/ML injection 4 mg (4 mg Intravenous Given 07/26/22 1848)  droperidol (INAPSINE) 2.5 MG/ML injection 2.5 mg (2.5 mg Intravenous Given 07/26/22 1848)  iohexol (OMNIPAQUE) 350 MG/ML injection 75 mL (75 mLs Intravenous Contrast Given 07/26/22 1848)    ED Course/ Medical Decision Making/ A&P   {  Medical Decision Making Lawrence Fischer is a 37 y.o. male.  With PMH of intractable nausea and vomiting who presents with diffuse generalized abdominal pain associate with nausea vomiting and diarrhea.   Patient presents with generalized nonlocalized tenderness associate with nonbloody nonbilious emesis, diarrhea.  Regarding his symptoms, consider cannabinoid hyperemesis, spasmodic abdominal pain, IBS, gastroenteritis, obstruction among multiple other underlying causes of symptoms.  Labs obtained and reviewed, mild  leukocytosis 11 with left shift with normal creatinine 1.04.  Mild acidosis bicarbonate 21 without gap.  Normal glucose 93.  No transaminitis.  Slightly elevated lipase 52 but not consistent with acute pancreatitis.  CTAP with IV contrast obtained which I personally reviewed, no evidence of obstruction on my exam.  Per radiology, no acute abnormality noted within the abdomen or pelvis.  Patient given IV fluids, morphine, droperidol with improvement of symptoms.  He was reassessed and tolerating p.o. at bedside having applesauce.  Discharging patient with prescription for as needed Reglan and Bentyl and referral to GI.  Strict return precaution discussed.  He is in agreement with plan and safe for discharge at this time.  Risk Prescription drug management.    Final Clinical Impression(s) / ED Diagnoses Final diagnoses:  None    Rx / DC Orders ED Discharge Orders     None         Elgie Congo, MD 07/26/22 2054

## 2022-07-26 NOTE — Discharge Instructions (Signed)
  You have been seen in the Emergency Department (ED) for abdominal pain. Your workup was generally reassuring; however, it did not identify a clear cause of your symptoms.  Please follow up with your primary care doctor as soon as possible regarding today's ED visit and the symptoms that are bothering you.  I have given you referral to GI, you can call the [facility] gastroenterology team listed in your discharge paperwork to make an appointment regarding your symptoms.  I have given you prescription for Reglan to help with nausea and vomiting as well as Bentyl for abdominal cramping.  Return to the ED if your abdominal pain worsens or fails to improve, you develop bloody vomiting, bloody diarrhea, you are unable to tolerate fluids due to vomiting, fever greater than 101, or any other concerning symptoms.

## 2022-07-26 NOTE — ED Triage Notes (Signed)
Pt with severe generalized abdominal pain x 24 hours with associated n/v/d. States seen for same prior and it has been attributed to smoking weed; however patient states he no longer smokes weed.

## 2022-09-19 ENCOUNTER — Emergency Department (HOSPITAL_COMMUNITY)
Admission: EM | Admit: 2022-09-19 | Discharge: 2022-09-19 | Disposition: A | Discharge status: Home / Self Care | Payer: 59 | Attending: Emergency Medicine | Admitting: Emergency Medicine

## 2022-09-19 DIAGNOSIS — R110 Nausea: Secondary | ICD-10-CM | POA: Diagnosis not present

## 2022-09-19 DIAGNOSIS — X58XXXA Exposure to other specified factors, initial encounter: Secondary | ICD-10-CM | POA: Insufficient documentation

## 2022-09-19 DIAGNOSIS — S025XXA Fracture of tooth (traumatic), initial encounter for closed fracture: Secondary | ICD-10-CM | POA: Diagnosis not present

## 2022-09-19 DIAGNOSIS — K0889 Other specified disorders of teeth and supporting structures: Secondary | ICD-10-CM

## 2022-09-19 MED ORDER — IBUPROFEN 800 MG PO TABS: 800.0000 mg | ORAL_TABLET | Freq: Three times a day (TID) | ORAL | 0 refills | Status: AC | PRN

## 2022-09-19 MED ORDER — ONDANSETRON 4 MG PO TBDP: 4.0000 mg | ORAL_TABLET | Freq: Three times a day (TID) | ORAL | 0 refills | Status: DC | PRN

## 2022-09-19 MED ORDER — OXYCODONE-ACETAMINOPHEN 5-325 MG PO TABS
1.0000 | ORAL_TABLET | Freq: Once | ORAL | Status: AC
  Administered 2022-09-19: 1 via ORAL
  Filled 2022-09-19: qty 1

## 2022-09-19 MED ORDER — PENICILLIN V POTASSIUM 500 MG PO TABS: 500.0000 mg | ORAL_TABLET | Freq: Four times a day (QID) | ORAL | 0 refills | Status: AC

## 2022-09-19 MED ORDER — ONDANSETRON 4 MG PO TBDP
8.0000 mg | ORAL_TABLET | Freq: Once | ORAL | Status: AC
  Administered 2022-09-19: 8 mg via ORAL
  Filled 2022-09-19: qty 2

## 2022-09-19 NOTE — ED Provider Notes (Signed)
[facility] Provider Note   CSN: [id number] Arrival date & time: 09/19/22  0805     History  Chief Complaint  Patient presents with   Dental Pain    Lawrence Fischer is a 37 y.o. male.  He is here with complaint of severe dental pain that started last night.  He has had a fractured tooth for a while but it acutely became painful last night.  Throbbing in nature.  Causing him to feel nauseous.  Not sure if he had a fever.  Has tried nothing for it.  Does not have a dentist.  The history is provided by the patient.  Dental Pain Location:  Lower Lower teeth location:  18/LL 2nd molar Quality:  Throbbing Severity:  Severe Onset quality:  Gradual Duration:  2 days Timing:  Constant Chronicity:  Recurrent Context: dental fracture   Relieved by:  None tried Associated symptoms: no fever and no trismus   Risk factors: lack of dental care and smoking        Home Medications Prior to Admission medications   Medication Sig Start Date End Date Taking? Authorizing Provider  Aspirin-Acetaminophen-Caffeine (GOODY HEADACHE PO) Take 1 packet by mouth 2 (two) times daily as needed (headache).    [provider]  cyclobenzaprine (FLEXERIL) 10 MG tablet Take 1 tablet (10 mg total) by mouth 3 (three) times daily as needed for muscle spasms. Patient not taking: Reported on 01/30/2022 12/15/21   Molpus, Jenny Reichmann, MD  dicyclomine (BENTYL) 20 MG tablet Take 1 tablet (20 mg total) by mouth 2 (two) times daily as needed for up to 30 doses for spasms. 07/26/22   Elgie Congo, MD  HYDROcodone-acetaminophen (NORCO/VICODIN) 5-325 MG tablet Take 1 tablet by mouth every 6 (six) hours as needed for up to 3 doses. Patient not taking: Reported on 02/16/2022 01/30/22   Leanord Asal K, DO  HYDROmorphone (DILAUDID) 2 MG tablet Take 1 tablet (2 mg total) by mouth every 4 (four) hours as needed for severe pain. Patient not taking: Reported on  01/30/2022 12/15/21   Molpus, John, MD  Menthol, Topical Analgesic, (BIOFREEZE COOL THE PAIN) 4 % GEL Apply 1 Application topically daily as needed (pain).    [provider]  methylPREDNISolone (MEDROL DOSEPAK) 4 MG TBPK tablet Day 1: 8 mg PO before breakfast, 4 mg after lunch and after dinner, and 8 mg at bedtime Day 2: 4 mg PO before breakfast, after lunch, and after dinner and 8 mg at bedtime Day 3: 4 mg PO before breakfast, after lunch, after dinner, and at bedtime Day 4: 4 mg PO before breakfast, after lunch, and at bedtime Day 5: 4 mg PO before breakfast and at bedtime Day 6: 4 mg PO before breakfast Patient not taking: Reported on 02/16/2022 01/30/22   Leanord Asal K, DO  metoCLOPramide (REGLAN) 10 MG tablet Take 1 tablet (10 mg total) by mouth every 6 (six) hours. 07/26/22   Elgie Congo, MD  ondansetron (ZOFRAN-ODT) 8 MG disintegrating tablet Take 1 tablet (8 mg total) by mouth every 8 (eight) hours as needed for nausea or vomiting. 02/16/22   Dorie Rank, MD  promethazine (PHENERGAN) 25 MG tablet Take 1 tablet (25 mg total) by mouth every 8 (eight) hours as needed for nausea or vomiting. Patient not taking: Reported on 05/11/2019 12/01/18 05/11/19  Quintella Reichert, MD      Allergies    Patient has no known allergies.    Review of  Systems   Review of Systems  Constitutional:  Negative for fever.  HENT:  Negative for trouble swallowing.   Gastrointestinal:  Positive for nausea.    Physical Exam Updated Vital Signs BP 114/78 (BP Location: Left Arm)   Pulse 73   Resp 16   SpO2 100%  Physical Exam Vitals and nursing note reviewed.  Constitutional:      Appearance: Normal appearance. He is well-developed.  HENT:     Head: Normocephalic and atraumatic.     Mouth/Throat:     Mouth: Mucous membranes are moist.   Eyes:     Conjunctiva/sclera: Conjunctivae normal.  Cardiovascular:     Rate and Rhythm: Normal rate and regular rhythm.     Heart sounds: No murmur  heard. Pulmonary:     Effort: Pulmonary effort is normal. No respiratory distress.     Breath sounds: Normal breath sounds.  Abdominal:     Palpations: Abdomen is soft.     Tenderness: There is no abdominal tenderness.  Musculoskeletal:     Cervical back: Neck supple.  Skin:    General: Skin is warm and dry.  Neurological:     Mental Status: He is alert.     GCS: GCS eye subscore is 4. GCS verbal subscore is 5. GCS motor subscore is 6.     ED Results / Procedures / Treatments   Labs (all labs ordered are listed, but only abnormal results are displayed) Labs Reviewed - No data to display  EKG None  Radiology No results found.  Procedures Procedures    Medications Ordered in ED Medications  ondansetron (ZOFRAN-ODT) disintegrating tablet 8 mg (has no administration in time range)  oxyCODONE-acetaminophen (PERCOCET/ROXICET) 5-325 MG per tablet 1 tablet (has no administration in time range)    ED Course/ Medical Decision Making/ A&P                             Medical Decision Making Risk Prescription drug management.   37 year old male with dental pain x 2 days.  Has dental fracture on exam without signs of deep space infection.  Appears to be in significant pain.  Getting some Zofran for his nausea and oral analgesia.  Will cover with antibiotics given list of dental resources.  Differential includes dental infection, abscess, deep space infection, airway compromise.        Final Clinical Impression(s) / ED Diagnoses Final diagnoses:  Pain, dental    Rx / DC Orders ED Discharge Orders          Ordered    penicillin v potassium (VEETID) 500 MG tablet  4 times daily        09/19/22 0908    ibuprofen (ADVIL) 800 MG tablet  Every 8 hours PRN        09/19/22 0908    ondansetron (ZOFRAN-ODT) 4 MG disintegrating tablet  Every 8 hours PRN        09/19/22 0908              Hayden Rasmussen, MD 09/19/22 1729

## 2022-09-19 NOTE — ED Triage Notes (Signed)
Patient with a broken tooth on bottom left of mouth complains of pain that started in same location last night. Patient alert, oriented, tearful at this time.

## 2022-10-08 ENCOUNTER — Emergency Department (HOSPITAL_COMMUNITY): Payer: Medicaid Other

## 2022-10-08 ENCOUNTER — Emergency Department (HOSPITAL_COMMUNITY)
Admission: EM | Admit: 2022-10-08 | Discharge: 2022-10-08 | Disposition: A | Discharge status: Home / Self Care | Payer: Medicaid Other | Attending: Emergency Medicine | Admitting: Emergency Medicine

## 2022-10-08 ENCOUNTER — Other Ambulatory Visit: Payer: Self-pay

## 2022-10-08 DIAGNOSIS — Z7982 Long term (current) use of aspirin: Secondary | ICD-10-CM | POA: Insufficient documentation

## 2022-10-08 DIAGNOSIS — R112 Nausea with vomiting, unspecified: Secondary | ICD-10-CM | POA: Diagnosis not present

## 2022-10-08 DIAGNOSIS — R1084 Generalized abdominal pain: Secondary | ICD-10-CM | POA: Insufficient documentation

## 2022-10-08 DIAGNOSIS — D72829 Elevated white blood cell count, unspecified: Secondary | ICD-10-CM | POA: Insufficient documentation

## 2022-10-08 LAB — URINALYSIS, ROUTINE W REFLEX MICROSCOPIC
Bacteria, UA: NONE SEEN
Bilirubin Urine: NEGATIVE
Glucose, UA: NEGATIVE mg/dL
Ketones, ur: 5 mg/dL — AB
Leukocytes,Ua: NEGATIVE
Nitrite: NEGATIVE
Protein, ur: NEGATIVE mg/dL
Specific Gravity, Urine: 1.009 (ref 1.005–1.030)
pH: 6 (ref 5.0–8.0)

## 2022-10-08 LAB — CBC
HCT: 41.4 % (ref 39.0–52.0)
Hemoglobin: 14 g/dL (ref 13.0–17.0)
MCH: 28.3 pg (ref 26.0–34.0)
MCHC: 33.8 g/dL (ref 30.0–36.0)
MCV: 83.8 fL (ref 80.0–100.0)
Platelets: 290 10*3/uL (ref 150–400)
RBC: 4.94 MIL/uL (ref 4.22–5.81)
RDW: 13.7 % (ref 11.5–15.5)
WBC: 10.6 10*3/uL — ABNORMAL HIGH (ref 4.0–10.5)
nRBC: 0 % (ref 0.0–0.2)

## 2022-10-08 LAB — COMPREHENSIVE METABOLIC PANEL
ALT: 13 U/L (ref 0–44)
AST: 16 U/L (ref 15–41)
Albumin: 4.2 g/dL (ref 3.5–5.0)
Alkaline Phosphatase: 41 U/L (ref 38–126)
Anion gap: 11 (ref 5–15)
BUN: 8 mg/dL (ref 6–20)
CO2: 20 mmol/L — ABNORMAL LOW (ref 22–32)
Calcium: 9.6 mg/dL (ref 8.9–10.3)
Chloride: 105 mmol/L (ref 98–111)
Creatinine, Ser: 1.09 mg/dL (ref 0.61–1.24)
GFR, Estimated: >60 mL/min (ref >60)
Glucose, Bld: 121 mg/dL — ABNORMAL HIGH (ref 70–99)
Potassium: 3.3 mmol/L — ABNORMAL LOW (ref 3.5–5.1)
Sodium: 136 mmol/L (ref 135–145)
Total Bilirubin: 0.9 mg/dL (ref 0.3–1.2)
Total Protein: 7.5 g/dL (ref 6.5–8.1)

## 2022-10-08 LAB — RAPID URINE DRUG SCREEN, HOSP PERFORMED
Amphetamines: NOT DETECTED
Barbiturates: NOT DETECTED
Benzodiazepines: NOT DETECTED
Cocaine: NOT DETECTED
Opiates: POSITIVE — AB
Tetrahydrocannabinol: POSITIVE — AB

## 2022-10-08 LAB — LIPASE, BLOOD: Lipase: 27 U/L (ref 11–51)

## 2022-10-08 MED ORDER — IOHEXOL 350 MG/ML SOLN
75.0000 mL | Freq: Once | INTRAVENOUS | Status: AC | PRN
  Administered 2022-10-08: 75 mL via INTRAVENOUS

## 2022-10-08 MED ORDER — DROPERIDOL 2.5 MG/ML IJ SOLN
2.5000 mg | Freq: Once | INTRAMUSCULAR | Status: AC
  Administered 2022-10-08: 2.5 mg via INTRAVENOUS
  Filled 2022-10-08: qty 2

## 2022-10-08 MED ORDER — ONDANSETRON 4 MG PO TBDP: 4.0000 mg | ORAL_TABLET | Freq: Three times a day (TID) | ORAL | 0 refills | Status: AC | PRN

## 2022-10-08 MED ORDER — LACTATED RINGERS IV BOLUS
1000.0000 mL | Freq: Once | INTRAVENOUS | Status: AC
  Administered 2022-10-08: 1000 mL via INTRAVENOUS

## 2022-10-08 MED ORDER — ONDANSETRON HCL 4 MG/2ML IJ SOLN
4.0000 mg | Freq: Once | INTRAMUSCULAR | Status: AC
  Administered 2022-10-08: 4 mg via INTRAVENOUS
  Filled 2022-10-08: qty 2

## 2022-10-08 MED ORDER — MORPHINE SULFATE (PF) 4 MG/ML IV SOLN
4.0000 mg | Freq: Once | INTRAVENOUS | Status: AC
  Administered 2022-10-08: 4 mg via INTRAVENOUS
  Filled 2022-10-08: qty 1

## 2022-10-08 MED ORDER — SODIUM CHLORIDE 0.9 % IV BOLUS
1000.0000 mL | Freq: Once | INTRAVENOUS | Status: AC
  Administered 2022-10-08: 1000 mL via INTRAVENOUS

## 2022-10-08 NOTE — ED Provider Notes (Signed)
[place] [facility]ERGENCY DEPARTMENT AT [facility] Provider Note   CSN: [id number] Arrival date & time: 10/08/22  1551    History  Chief Complaint  Patient presents with   Abdominal Pain   Emesis    Lawrence Fischer is a 37 y.o. male history of cyclical vomiting here for evaluation abdominal pain as well as multiple episodes of NBNB emesis.  Began yesterday.  Abdominal pain diffuse in nature.  States he feels very dehydrated.  Does use daily marijuana however states he has not had emesis like this in "quite some time."  Denies any dysuria or hematuria however admits to decreased urine due to decreased p.o. intake.  No suspicious foods.  No alcohol use.  No NSAIDs. Does have remote history of ulcer however denies any recent complications with this.  HPI     Home Medications Prior to Admission medications   Medication Sig Start Date End Date Taking? Authorizing Provider  ondansetron (ZOFRAN-ODT) 4 MG disintegrating tablet Take 1 tablet (4 mg total) by mouth every 8 (eight) hours as needed for nausea or vomiting. 10/08/22  Yes Ferdie Bakken A, PA-C  Aspirin-Acetaminophen-Caffeine (GOODY HEADACHE PO) Take 1 packet by mouth 2 (two) times daily as needed (headache).    [provider]  dicyclomine (BENTYL) 20 MG tablet Take 1 tablet (20 mg total) by mouth 2 (two) times daily as needed for up to 30 doses for spasms. 07/26/22   Mardene Sayer, MD  ibuprofen (ADVIL) 800 MG tablet Take 1 tablet (800 mg total) by mouth every 8 (eight) hours as needed. 09/19/22   Terrilee Files, MD  Menthol, Topical Analgesic, (BIOFREEZE COOL THE PAIN) 4 % GEL Apply 1 Application topically daily as needed (pain).    [provider]  metoCLOPramide (REGLAN) 10 MG tablet Take 1 tablet (10 mg total) by mouth every 6 (six) hours. 07/26/22   Mardene Sayer, MD  promethazine (PHENERGAN) 25 MG tablet Take 1 tablet (25 mg total) by mouth every 8 (eight) hours as needed for nausea  or vomiting. Patient not taking: Reported on 05/11/2019 12/01/18 05/11/19  Tilden Fossa, MD      Allergies    Patient has no known allergies.    Review of Systems   Review of Systems  Constitutional: Negative.   HENT: Negative.    Respiratory: Negative.    Cardiovascular: Negative.   Gastrointestinal:  Positive for abdominal pain, nausea and vomiting. Negative for rectal pain.  Genitourinary: Negative.   Musculoskeletal: Negative.   Skin: Negative.   Neurological: Negative.   All other systems reviewed and are negative.   Physical Exam Updated Vital Signs BP 115/73 (BP Location: Right Arm)   Pulse 71   Temp 98.4 F (36.9 C) (Oral)   Resp 16   SpO2 100%  Physical Exam Vitals and nursing note reviewed.  Constitutional:      General: He is not in acute distress.    Appearance: He is well-developed. He is not ill-appearing or diaphoretic.  HENT:     Head: Atraumatic.  Eyes:     Pupils: Pupils are equal, round, and reactive to light.  Cardiovascular:     Rate and Rhythm: Normal rate and regular rhythm.     Heart sounds: Normal heart sounds.  Pulmonary:     Effort: Pulmonary effort is normal. No respiratory distress.     Breath sounds: Normal breath sounds.  Abdominal:     General: Bowel sounds are normal. There is no distension.  Palpations: Abdomen is soft.     Tenderness: There is generalized abdominal tenderness. There is guarding. There is no right CVA tenderness, left CVA tenderness or rebound. Negative signs include Murphy's sign.     Hernia: No hernia is present.  Musculoskeletal:        General: Normal range of motion.     Cervical back: Normal range of motion and neck supple.  Skin:    General: Skin is warm and dry.  Neurological:     General: No focal deficit present.     Mental Status: He is alert and oriented to person, place, and time.    ED Results / Procedures / Treatments   Labs (all labs ordered are listed, but only abnormal results are  displayed) Labs Reviewed  COMPREHENSIVE METABOLIC PANEL - Abnormal; Notable for the following components:      Result Value   Potassium 3.3 (*)    CO2 20 (*)    Glucose, Bld 121 (*)    All other components within normal limits  CBC - Abnormal; Notable for the following components:   WBC 10.6 (*)    All other components within normal limits  URINALYSIS, ROUTINE W REFLEX MICROSCOPIC - Abnormal; Notable for the following components:   Color, Urine STRAW (*)    Hgb urine dipstick SMALL (*)    Ketones, ur 5 (*)    All other components within normal limits  RAPID URINE DRUG SCREEN, HOSP PERFORMED - Abnormal; Notable for the following components:   Opiates POSITIVE (*)    Tetrahydrocannabinol POSITIVE (*)    All other components within normal limits  LIPASE, BLOOD    EKG None  Radiology CT ABDOMEN PELVIS W CONTRAST  Result Date: 10/08/2022 CLINICAL DATA:  Acute abdominal pain EXAM: CT ABDOMEN AND PELVIS WITH CONTRAST TECHNIQUE: Multidetector CT imaging of the abdomen and pelvis was performed using the standard protocol following bolus administration of intravenous contrast. RADIATION DOSE REDUCTION: This exam was performed according to the departmental dose-optimization program which includes automated exposure control, adjustment of the mA and/or kV according to patient size and/or use of iterative reconstruction technique. CONTRAST:  75mL OMNIPAQUE IOHEXOL 350 MG/ML SOLN COMPARISON:  None Available. FINDINGS: Lower chest: Lung bases are clear. Hepatobiliary: Liver is within normal limits. Gallbladder is unremarkable. No intrahepatic or extrahepatic dilatation. Pancreas: Within normal limits. Spleen: Within normal limits. Adrenals/Urinary Tract: Adrenal glands are within normal limits. Subcentimeter simple left renal cysts, benign (Bosniak I). No follow-up is recommended. Right kidney is within normal limits.  No hydronephrosis. Bladder is within normal limits. Stomach/Bowel: Stomach is within  normal limits. No evidence of bowel obstruction. Normal appendix (series 3/image 85). No colonic wall thickening or inflammatory changes. Vascular/Lymphatic: No evidence of abdominal aortic aneurysm. 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 No suspicious abdominopelvic lymphadenopathy. Reproductive: Prostate is unremarkable. Other: No abdominopelvic ascites. Musculoskeletal: Visualized osseous structures are within normal limits. IMPRESSION: Negative CT abdomen/pelvis. Electronically Signed   By: Charline Bills M.D.   On: 10/08/2022 19:14    Procedures Procedures    Medications Ordered in ED Medications  lactated ringers bolus 1,000 mL (0 mLs Intravenous Stopped 10/08/22 1842)  sodium chloride 0.9 % bolus 1,000 mL (0 mLs Intravenous Stopped 10/08/22 1842)  morphine (PF) 4 MG/ML injection 4 mg (4 mg Intravenous Given 10/08/22 1711)  ondansetron (ZOFRAN) injection 4 mg (4 mg Intravenous Given 10/08/22 1711)  iohexol (OMNIPAQUE) 350 MG/ML injection 75 mL (75 mLs Intravenous Contrast Given 10/08/22 1850)  droperidol (INAPSINE) 2.5 MG/ML  injection 2.5 mg (2.5 mg Intravenous Given 10/08/22 1959)    ED Course/ Medical Decision Making/ A&P   37 year old here for evaluation of nausea, vomiting abdominal pain.  Began yesterday.  Abdominal pain diffuse in nature.  Multiple episodes of NBNB emesis.  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  Denies any bloody stool, UTI symptoms.  Does have some back pain however denies any NSAID use, GI bleeds, alcohol use.  Appears uncomfortable on exam with diffuse abdominal tenderness with voluntary guarding. No CP, chest wall crepitus.  Will plan on labs, imaging, pain management and reassess  Labs and imaging personally viewed and interpreted:  CBC leukocytosis 10.6 Metabolic panel potassium 3.3, glucose 121 Lipase 27 UA neg for infection UDS positive for opiates (pain meds in ED) and THC CT abdomen pelvis without significant findings  Patient  reassessed.  Still has some nausea and some pain.  Will trial droperidol, suspect symptoms likely due to cannabinoid hyperemesis  Reassessed.  Feels somewhat improved.  Requesting p.o.  Will trial p.o. challenge and reassess  Patient reassessed. Tolerated PO intake. Abd improved. Suspect cyclical emesis from THC. Rec obstaining. ODT Zofran as needed.  Return for new or worsening symptoms  Patient is nontoxic, nonseptic appearing, in no apparent distress.  Patient's pain and other symptoms adequately managed in emergency department.  Fluid bolus given.  Labs, imaging and vitals reviewed.  Patient does not meet the SIRS or Sepsis criteria.  On repeat exam patient does not have a surgical abdomin and there are no peritoneal signs.  No indication of appendicitis, bowel obstruction, bowel perforation, cholecystitis, diverticulitis.  Patient discharged home with symptomatic treatment and given strict instructions for follow-up with their primary care physician.  I have also discussed reasons to return immediately to the ER.  Patient expresses understanding and agrees with plan.                              Medical Decision Making Amount and/or Complexity of Data Reviewed External Data Reviewed: labs, radiology, ECG and notes. Labs: ordered. Decision-making details documented in ED Course. Radiology: ordered and independent interpretation performed. Decision-making details documented in ED Course. ECG/medicine tests: ordered and independent interpretation performed. Decision-making details documented in ED Course.  Risk OTC drugs. Prescription drug management. Parenteral controlled substances. Decision regarding hospitalization. Diagnosis or treatment significantly limited by social determinants of health.           Final Clinical Impression(s) / ED Diagnoses Final diagnoses:  Nausea and vomiting, unspecified vomiting type  Generalized abdominal pain    Rx / DC Orders ED Discharge  Orders          Ordered    ondansetron (ZOFRAN-ODT) 4 MG disintegrating tablet  Every 8 hours PRN        10/08/22 2130              Belvia Gotschall A, PA-C 10/08/22 2248    Glyn Ade, MD 10/09/22 1449

## 2022-10-08 NOTE — Discharge Instructions (Signed)
Your workup today was reassuring.  I have written you for a nausea medication to take as needed.  Would recommend cessation of marijuana as this can worsen vomiting  Return for new or worsening symptoms

## 2022-10-08 NOTE — ED Notes (Signed)
Patient able to hold down of water without any nausea or vomiting

## 2022-10-08 NOTE — ED Notes (Signed)
Attempted to collect urine sample, pt states "I need fluids first, I dont have anything in me"

## 2022-10-08 NOTE — ED Triage Notes (Signed)
Pt presents with n/v x 24 hours.   Hx of same States he has diffuse abdominal pain and "cold sweats"

## 2022-11-11 ENCOUNTER — Encounter (HOSPITAL_COMMUNITY): Payer: Self-pay

## 2022-11-11 ENCOUNTER — Other Ambulatory Visit: Payer: Self-pay

## 2022-11-11 ENCOUNTER — Emergency Department (HOSPITAL_COMMUNITY)
Admission: EM | Admit: 2022-11-11 | Discharge: 2022-11-11 | Disposition: A | Discharge status: Home / Self Care | Payer: Medicaid Other | Attending: Emergency Medicine | Admitting: Emergency Medicine

## 2022-11-11 ENCOUNTER — Emergency Department (HOSPITAL_COMMUNITY): Payer: Medicaid Other

## 2022-11-11 DIAGNOSIS — R1013 Epigastric pain: Secondary | ICD-10-CM | POA: Insufficient documentation

## 2022-11-11 DIAGNOSIS — R079 Chest pain, unspecified: Secondary | ICD-10-CM | POA: Insufficient documentation

## 2022-11-11 DIAGNOSIS — Z7982 Long term (current) use of aspirin: Secondary | ICD-10-CM | POA: Insufficient documentation

## 2022-11-11 LAB — CBC
HCT: 45 % (ref 39.0–52.0)
Hemoglobin: 15.2 g/dL (ref 13.0–17.0)
MCH: 28.5 pg (ref 26.0–34.0)
MCHC: 33.8 g/dL (ref 30.0–36.0)
MCV: 84.3 fL (ref 80.0–100.0)
Platelets: 258 10*3/uL (ref 150–400)
RBC: 5.34 MIL/uL (ref 4.22–5.81)
RDW: 13.3 % (ref 11.5–15.5)
WBC: 7.2 10*3/uL (ref 4.0–10.5)
nRBC: 0 % (ref 0.0–0.2)

## 2022-11-11 LAB — TROPONIN I (HIGH SENSITIVITY)
Troponin I (High Sensitivity): 2 ng/L (ref <18)
Troponin I (High Sensitivity): <2 ng/L (ref <18)

## 2022-11-11 LAB — BASIC METABOLIC PANEL
Anion gap: 14 (ref 5–15)
BUN: 14 mg/dL (ref 6–20)
CO2: 18 mmol/L — ABNORMAL LOW (ref 22–32)
Calcium: 9.5 mg/dL (ref 8.9–10.3)
Chloride: 104 mmol/L (ref 98–111)
Creatinine, Ser: 0.9 mg/dL (ref 0.61–1.24)
GFR, Estimated: >60 mL/min (ref >60)
Glucose, Bld: 99 mg/dL (ref 70–99)
Potassium: 3.7 mmol/L (ref 3.5–5.1)
Sodium: 136 mmol/L (ref 135–145)

## 2022-11-11 MED ORDER — FENTANYL CITRATE PF 50 MCG/ML IJ SOSY
50.0000 ug | PREFILLED_SYRINGE | Freq: Once | INTRAMUSCULAR | Status: AC
  Administered 2022-11-11: 50 ug via INTRAVENOUS
  Filled 2022-11-11: qty 1

## 2022-11-11 MED ORDER — CELECOXIB 200 MG PO CAPS: 200.0000 mg | ORAL_CAPSULE | Freq: Every day | ORAL | 0 refills | Status: AC

## 2022-11-11 MED ORDER — ALUM & MAG HYDROXIDE-SIMETH 200-200-20 MG/5ML PO SUSP
15.0000 mL | Freq: Once | ORAL | Status: AC
  Administered 2022-11-11: 15 mL via ORAL
  Filled 2022-11-11: qty 30

## 2022-11-11 MED ORDER — FAMOTIDINE 20 MG PO TABS
20.0000 mg | ORAL_TABLET | Freq: Once | ORAL | Status: AC
  Administered 2022-11-11: 20 mg via ORAL
  Filled 2022-11-11: qty 1

## 2022-11-11 MED ORDER — SODIUM CHLORIDE 0.9 % IV BOLUS
500.0000 mL | Freq: Once | INTRAVENOUS | Status: AC
  Administered 2022-11-11: 500 mL via INTRAVENOUS

## 2022-11-11 MED ORDER — METHOCARBAMOL 500 MG PO TABS: 500.0000 mg | ORAL_TABLET | Freq: Two times a day (BID) | ORAL | 0 refills | Status: DC | PRN

## 2022-11-11 MED ORDER — FAMOTIDINE 20 MG PO TABS: 20.0000 mg | ORAL_TABLET | Freq: Two times a day (BID) | ORAL | 0 refills | Status: AC

## 2022-11-11 MED ORDER — SODIUM CHLORIDE (PF) 0.9 % IJ SOLN
INTRAMUSCULAR | Status: AC
  Filled 2022-11-11: qty 50

## 2022-11-11 MED ORDER — KETOROLAC TROMETHAMINE 15 MG/ML IJ SOLN
15.0000 mg | Freq: Once | INTRAMUSCULAR | Status: AC
  Administered 2022-11-11: 15 mg via INTRAVENOUS
  Filled 2022-11-11: qty 1

## 2022-11-11 MED ORDER — IOHEXOL 350 MG/ML SOLN
100.0000 mL | Freq: Once | INTRAVENOUS | Status: AC | PRN
  Administered 2022-11-11: 100 mL via INTRAVENOUS

## 2022-11-11 NOTE — ED Provider Notes (Signed)
[place] EMERGENCY DEPARTMENT AT [facility] Provider Note   CSN: [id number] Arrival date & time: 11/11/22  1220     History  Chief Complaint  Patient presents with   Chest Pain    Lawrence Fischer is a 37 y.o. male.   Chest Pain   37 year old male presents emergency department complaints of chest pain.  Patient states for the past week has had thoracic back pain.  Patient states that he has had intermittent thoracic back pain for the past several years.  States that pain currently is worse than prior episodes of pain.  States that he developed central chest pain 2 to 3 days ago.  States that he felt some accompanying shortness of breath.  Reports worsening of symptoms while eating or shortly thereafter.  Denies fever, cough, nausea, vomiting, abdominal pain, urinary symptoms, change in bowel habits.  Denies history of DVT/PE, recent surgery/immobilization, known coagulopathy, known malignancy  Past medical history significant for intractable nausea and vomiting  Home Medications Prior to Admission medications   Medication Sig Start Date End Date Taking? Authorizing Provider  celecoxib (CELEBREX) 200 MG capsule Take 1 capsule (200 mg total) by mouth daily. 11/11/22  Yes Sherian Maroon A, PA  famotidine (PEPCID) 20 MG tablet Take 1 tablet (20 mg total) by mouth 2 (two) times daily. 11/11/22  Yes Sherian Maroon A, PA  methocarbamol (ROBAXIN) 500 MG tablet Take 1 tablet (500 mg total) by mouth 2 (two) times daily as needed for muscle spasms. 11/11/22  Yes Sherian Maroon A, PA  Aspirin-Acetaminophen-Caffeine (GOODY HEADACHE PO) Take 1 packet by mouth 2 (two) times daily as needed (headache).    [provider]  dicyclomine (BENTYL) 20 MG tablet Take 1 tablet (20 mg total) by mouth 2 (two) times daily as needed for up to 30 doses for spasms. 07/26/22   Mardene Sayer, MD  ibuprofen (ADVIL) 800 MG tablet Take 1 tablet (800 mg total) by mouth every 8 (eight)  hours as needed. 09/19/22   Terrilee Files, MD  Menthol, Topical Analgesic, (BIOFREEZE COOL THE PAIN) 4 % GEL Apply 1 Application topically daily as needed (pain).    [provider]  metoCLOPramide (REGLAN) 10 MG tablet Take 1 tablet (10 mg total) by mouth every 6 (six) hours. 07/26/22   Mardene Sayer, MD  ondansetron (ZOFRAN-ODT) 4 MG disintegrating tablet Take 1 tablet (4 mg total) by mouth every 8 (eight) hours as needed for nausea or vomiting. 10/08/22   Henderly, Britni A, PA-C  promethazine (PHENERGAN) 25 MG tablet Take 1 tablet (25 mg total) by mouth every 8 (eight) hours as needed for nausea or vomiting. Patient not taking: Reported on 05/11/2019 12/01/18 05/11/19  Tilden Fossa, MD      Allergies    Patient has no known allergies.    Review of Systems   Review of Systems  Cardiovascular:  Positive for chest pain.  All other systems reviewed and are negative.   Physical Exam Updated Vital Signs BP 107/84   Pulse 70   Temp 98.5 F (36.9 C) (Oral)   Resp 15   Ht 5\' 10"  (1.778 m)   Wt 68 kg   SpO2 99%   BMI 21.52 kg/m  Physical Exam Vitals and nursing note reviewed.  Constitutional:      General: He is not in acute distress.    Appearance: He is well-developed.  HENT:     Head: Normocephalic and atraumatic.  Eyes:  Conjunctiva/sclera: Conjunctivae normal.  Cardiovascular:     Rate and Rhythm: Normal rate and regular rhythm.     Heart sounds: No murmur heard. Pulmonary:     Effort: Pulmonary effort is normal. No respiratory distress.     Breath sounds: Normal breath sounds. No wheezing, rhonchi or rales.  Chest:     Chest wall: No edema.  Abdominal:     Palpations: Abdomen is soft.     Tenderness: There is abdominal tenderness.     Comments: Epigastric abdominal tenderness to palpation.  Musculoskeletal:        General: No swelling.     Cervical back: Neck supple.     Right lower leg: No edema.     Left lower leg: No edema.  Skin:     General: Skin is warm and dry.     Capillary Refill: Capillary refill takes less than 2 seconds.  Neurological:     Mental Status: He is alert.  Psychiatric:        Mood and Affect: Mood normal.     ED Results / Procedures / Treatments   Labs (all labs ordered are listed, but only abnormal results are displayed) Labs Reviewed  BASIC METABOLIC PANEL - Abnormal; Notable for the following components:      Result Value   CO2 18 (*)    All other components within normal limits  CBC  TROPONIN I (HIGH SENSITIVITY)  TROPONIN I (HIGH SENSITIVITY)    EKG None  Radiology CT Angio Chest/Abd/Pel for Dissection W and/or W/WO  Result Date: 11/11/2022 CLINICAL DATA:  Acute aortic syndrome suspected. Chest pain 1 week. Shortness of breath. EXAM: CT ANGIOGRAPHY CHEST, ABDOMEN AND PELVIS TECHNIQUE: Non-contrast CT of the chest was initially obtained. Multidetector CT imaging through the chest, abdomen and pelvis was performed using the standard protocol during bolus administration of intravenous contrast. Multiplanar reconstructed images and MIPs were obtained and reviewed to evaluate the vascular anatomy. RADIATION DOSE REDUCTION: This exam was performed according to the departmental dose-optimization program which includes automated exposure control, adjustment of the mA and/or kV according to patient size and/or use of iterative reconstruction technique. CONTRAST:  OMNIPAQUE IOHEXOL 350 MG/ML SOLN COMPARISON:  CT of the abdomen and pelvis with contrast 10/08/2022. FINDINGS: CTA CHEST FINDINGS Cardiovascular: The heart size is normal. Aortic arch and great vessel origins are within normal limits. The descending thoracic aorta is normal. Pulmonary arteries are within normal limits. Mediastinum/Nodes: No enlarged mediastinal, hilar, or axillary lymph nodes. Thyroid gland, trachea, and esophagus demonstrate no significant findings. Lungs/Pleura: Lungs are clear. No pleural effusion or pneumothorax.  Musculoskeletal: No chest wall abnormality. No acute or significant osseous findings. Review of the MIP images confirms the above findings. CTA ABDOMEN AND PELVIS FINDINGS VASCULAR Aorta: Normal caliber aorta without aneurysm, dissection, vasculitis or significant stenosis. Celiac: Patent without evidence of aneurysm, dissection, vasculitis or significant stenosis. SMA: Patent without evidence of aneurysm, dissection, vasculitis or significant stenosis. Renals: Both renal arteries are patent without evidence of aneurysm, dissection, vasculitis, fibromuscular dysplasia or significant stenosis. IMA: Patent without evidence of aneurysm, dissection, vasculitis or significant stenosis. Inflow: Patent without evidence of aneurysm, dissection, vasculitis or significant stenosis. Veins: No obvious venous abnormality within the limitations of this arterial phase study. Review of the MIP images confirms the above findings. NON-VASCULAR Hepatobiliary: No focal liver abnormality is seen. No gallstones, gallbladder wall thickening, or biliary dilatation. Pancreas: Unremarkable. No pancreatic ductal dilatation or surrounding inflammatory changes. Spleen: Normal in size without focal abnormality.  Adrenals/Urinary Tract: Adrenal glands are unremarkable. Kidneys are normal, without renal calculi, focal lesion, or hydronephrosis. Bladder is unremarkable. Stomach/Bowel: Stomach is within normal limits. Appendix appears normal. No evidence of bowel wall thickening, distention, or inflammatory changes. Lymphatic: No significant abdominal or pelvic adenopathy is present. Reproductive: Prostate is unremarkable. Other: No abdominal wall hernia or abnormality. No abdominopelvic ascites. Musculoskeletal: No acute or significant osseous findings. Review of the MIP images confirms the above findings. IMPRESSION: Normal CTA of the chest, abdomen, and pelvis. No acute or focal lesion to explain the patient's symptoms. Electronically Signed   By:  Marin Roberts M.D.   On: 11/11/2022 17:40   DG Chest 2 View  Result Date: 11/11/2022 CLINICAL DATA:  chest pain EXAM: CHEST - 2 VIEW COMPARISON:  April 13th 2024 CT abdomen pelvis. FINDINGS: The cardiomediastinal silhouette is normal in contour. No pleural effusion. No pneumothorax. No acute pleuroparenchymal abnormality. Visualized abdomen is unremarkable. No acute osseous abnormality noted. IMPRESSION: No acute cardiopulmonary abnormality. Electronically Signed   By: Meda Klinefelter M.D.   On: 11/11/2022 13:03    Procedures Procedures    Medications Ordered in ED Medications  famotidine (PEPCID) tablet 20 mg (20 mg Oral Given 11/11/22 1318)  alum & mag hydroxide-simeth (MAALOX/MYLANTA) 200-200-20 MG/5ML suspension 15 mL (15 mLs Oral Given 11/11/22 1318)  ketorolac (TORADOL) 15 MG/ML injection 15 mg (15 mg Intravenous Given 11/11/22 1319)  sodium chloride 0.9 % bolus 500 mL (0 mLs Intravenous Stopped 11/11/22 1418)  fentaNYL (SUBLIMAZE) injection 50 mcg (50 mcg Intravenous Given 11/11/22 1418)  iohexol (OMNIPAQUE) 350 MG/ML injection 100 mL (100 mLs Intravenous Contrast Given 11/11/22 1706)    ED Course/ Medical Decision Making/ A&P Clinical Course as of 11/11/22 2155  Fri Nov 11, 2022  1414 Reevaluation of the patient still showed patient with pain despite medications administered.  Pain from chest radiation to back.  She decision made conversation was had with patient regarding getting CT imaging for rule out of aortic dissection for which he was compliant.  Will obtain CT imaging and reassess afterwards. [CR]    Clinical Course User Index [CR] Peter Garter, PA                             Medical Decision Making Amount and/or Complexity of Data Reviewed Labs: ordered. Radiology: ordered.  Risk OTC drugs. Prescription drug management.   This patient presents to the ED for concern of chest pain, this involves an extensive number of treatment options, and is a  complaint that carries with it a high risk of complications and morbidity.  The differential diagnosis includes ACS, PE, pneumothorax, pneumonia, aortic dissection, aortic aneurysm, gastritis, PUD, myocarditis/pericarditis/tamponade, musculoskeletal   Co morbidities that complicate the patient evaluation  See HPI   Additional history obtained:  Additional history obtained from EMR External records from outside source obtained and reviewed including hospital records   Lab Tests:  I Ordered, and personally interpreted labs.  The pertinent results include: No leukocytosis noted.  No evidence of anemia.  Platelets within range.  Mild decrease in bicarb of 18 supplemented via IV fluids but otherwise, electrolytes within normal limits.  No renal dysfunction.  Initial troponin less than 2 with repeat less than 2.   Imaging Studies ordered:  I ordered imaging studies including chest x-ray, CT angio dissection study I independently visualized and interpreted imaging which showed  Chest x-ray: No acute abnormalities CT angio dissection study: No acute  abnormalities I agree with the radiologist interpretation   Cardiac Monitoring: / EKG:  The patient was maintained on a cardiac monitor.  I personally viewed and interpreted the cardiac monitored which showed an underlying rhythm of: Sinus rhythm   Consultations Obtained:  N/a   Problem List / ED Course / Critical interventions / Medication management  Chest pain I ordered medication including Pepcid, Maalox, Toradol, fentanyl, 500 cc normal saline   Reevaluation of the patient after these medicines showed that the patient improved I have reviewed the patients home medicines and have made adjustments as needed   Social Determinants of Health:  Denies tobacco, drug use   Test / Admission - Considered:  Chest pain Vitals signs within normal range and stable throughout visit. Laboratory/imaging studies significant for: See  above 37 year old male presents emergency department with complaints of chest pain radiation to his back.  Patient's workup today overall reassuring.  Patient with delta negative troponin without obvious acute ischemic changes on EKG.  Patient with heart score 03 so doubt ACS.  Patient with Wells PE score of 0 with PERC negative so doubt PE.  Dissection study was performed given patient's chest pain radiation to back of which was negative for any aortic aneurysm/dissection.  Patient did note some improvement of symptoms with GI cocktail as well as additional medications administered.  Will recommend continued use of similar medications in outpatient setting as well as follow-up with primary care for reassessment of symptoms.  Treatment plan discussed at length with patient he acknowledged understanding was agreeable to said plan.  Patient overall well-appearing, afebrile in no acute distress. Worrisome signs and symptoms were discussed with the patient, and the patient acknowledged understanding to return to the ED if noticed. Patient was stable upon discharge.          Final Clinical Impression(s) / ED Diagnoses Final diagnoses:  Chest pain, unspecified type    Rx / DC Orders ED Discharge Orders          Ordered    methocarbamol (ROBAXIN) 500 MG tablet  2 times daily PRN        11/11/22 1746    famotidine (PEPCID) 20 MG tablet  2 times daily        11/11/22 1746    celecoxib (CELEBREX) 200 MG capsule  Daily        11/11/22 1746              Peter Garter, [place] 11/11/22 2155    Tegeler, Canary Brim, MD 11/12/22 [phone]

## 2022-11-11 NOTE — Discharge Instructions (Signed)
The workup today was overall reassuring.  No evidence of heart attack.  No evidence of vessel abnormality or other appreciable abnormality on CT exam of the chest.  Recommend continued use of Pepcid as well as anti-inflammatory and muscle laxer to use as needed.  Recommend follow-up with primary care for repeat assessment of your symptoms.  Please do not hesitate to return to emergency department for worrisome signs and symptoms we discussed become apparent.

## 2022-11-11 NOTE — ED Triage Notes (Signed)
Patient has had centralized chest pain for [redacted] week along with shortness of breath. Pain radiates to upper back. No nausea or vomiting.

## 2023-01-28 ENCOUNTER — Encounter (HOSPITAL_BASED_OUTPATIENT_CLINIC_OR_DEPARTMENT_OTHER): Payer: Self-pay

## 2023-01-28 ENCOUNTER — Emergency Department (HOSPITAL_BASED_OUTPATIENT_CLINIC_OR_DEPARTMENT_OTHER)
Admission: EM | Admit: 2023-01-28 | Discharge: 2023-01-28 | Disposition: A | Discharge status: Home / Self Care | Payer: Medicaid Other | Source: Home / Self Care | Attending: Emergency Medicine | Admitting: Emergency Medicine

## 2023-01-28 ENCOUNTER — Other Ambulatory Visit: Payer: Self-pay

## 2023-01-28 ENCOUNTER — Emergency Department (HOSPITAL_BASED_OUTPATIENT_CLINIC_OR_DEPARTMENT_OTHER): Payer: Medicaid Other | Admitting: Radiology

## 2023-01-28 DIAGNOSIS — Y92410 Unspecified street and highway as the place of occurrence of the external cause: Secondary | ICD-10-CM | POA: Insufficient documentation

## 2023-01-28 DIAGNOSIS — R079 Chest pain, unspecified: Secondary | ICD-10-CM | POA: Diagnosis not present

## 2023-01-28 DIAGNOSIS — S3992XA Unspecified injury of lower back, initial encounter: Secondary | ICD-10-CM | POA: Diagnosis present

## 2023-01-28 DIAGNOSIS — S39012A Strain of muscle, fascia and tendon of lower back, initial encounter: Secondary | ICD-10-CM | POA: Insufficient documentation

## 2023-01-28 MED ORDER — DIAZEPAM 2 MG PO TABS
1.0000 mg | ORAL_TABLET | Freq: Four times a day (QID) | ORAL | 0 refills | Status: AC | PRN
Start: 2023-01-28 — End: ?

## 2023-01-28 MED ORDER — DIAZEPAM 2 MG PO TABS
2.0000 mg | ORAL_TABLET | Freq: Once | ORAL | Status: AC
  Administered 2023-01-28: 2 mg via ORAL
  Filled 2023-01-28: qty 1

## 2023-01-28 MED ORDER — CELECOXIB 200 MG PO CAPS: 200.0000 mg | ORAL_CAPSULE | Freq: Two times a day (BID) | ORAL | 0 refills | Status: AC

## 2023-01-28 MED ORDER — METHYLPREDNISOLONE 4 MG PO TBPK: ORAL_TABLET | ORAL | 0 refills | Status: AC

## 2023-01-28 MED ORDER — DEXAMETHASONE SODIUM PHOSPHATE 10 MG/ML IJ SOLN
10.0000 mg | Freq: Once | INTRAMUSCULAR | Status: AC
  Administered 2023-01-28: 10 mg via INTRAMUSCULAR
  Filled 2023-01-28: qty 1

## 2023-01-28 MED ORDER — KETOROLAC TROMETHAMINE 60 MG/2ML IM SOLN
60.0000 mg | Freq: Once | INTRAMUSCULAR | Status: AC
  Administered 2023-01-28: 60 mg via INTRAMUSCULAR
  Filled 2023-01-28: qty 2
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
## 2023-01-28 NOTE — ED Notes (Signed)
 RN reviewed discharge instructions with pt. Pt verbalized understanding and had no further questions. VSS upon discharge.  

## 2023-01-28 NOTE — ED Notes (Signed)
Patient transported to CT 

## 2023-01-28 NOTE — ED Notes (Signed)
Patient transported to X-ray 

## 2023-01-30 ENCOUNTER — Emergency Department (HOSPITAL_COMMUNITY)
Admission: EM | Admit: 2023-01-30 | Discharge: 2023-01-30 | Disposition: A | Discharge status: Home / Self Care | Payer: Medicaid Other | Attending: Emergency Medicine | Admitting: Emergency Medicine

## 2023-01-30 ENCOUNTER — Emergency Department (HOSPITAL_COMMUNITY): Payer: Medicaid Other

## 2023-01-30 ENCOUNTER — Other Ambulatory Visit: Payer: Self-pay

## 2023-01-30 ENCOUNTER — Encounter (HOSPITAL_COMMUNITY): Payer: Self-pay

## 2023-01-30 DIAGNOSIS — M545 Low back pain, unspecified: Secondary | ICD-10-CM | POA: Diagnosis not present

## 2023-01-30 DIAGNOSIS — G8929 Other chronic pain: Secondary | ICD-10-CM | POA: Diagnosis not present

## 2023-01-30 DIAGNOSIS — Z7982 Long term (current) use of aspirin: Secondary | ICD-10-CM | POA: Insufficient documentation

## 2023-01-30 MED ORDER — METHOCARBAMOL 500 MG PO TABS: 500.0000 mg | ORAL_TABLET | Freq: Two times a day (BID) | ORAL | 0 refills | Status: DC

## 2023-01-30 MED ORDER — METHOCARBAMOL 500 MG PO TABS
1000.0000 mg | ORAL_TABLET | Freq: Once | ORAL | Status: AC
  Administered 2023-01-30: 1000 mg via ORAL
  Filled 2023-01-30: qty 2

## 2023-01-30 MED ORDER — KETOROLAC TROMETHAMINE 30 MG/ML IJ SOLN
30.0000 mg | Freq: Once | INTRAMUSCULAR | Status: AC
  Administered 2023-01-30: 30 mg via INTRAMUSCULAR
  Filled 2023-01-30: qty 1

## 2023-01-30 MED ORDER — LIDOCAINE 5 % EX PTCH: 1.0000 | MEDICATED_PATCH | CUTANEOUS | 0 refills | Status: DC
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
## 2023-01-30 NOTE — Discharge Instructions (Addendum)
As we discussed, in the absence of a fracture or other traumatic injury your back pain is more likely muscular.  I recommend taking the entire course of steroids that were prescribed at your last ER visit.  Please also take Tylenol and ibuprofen as discussed below.  You will need to follow-up with a primary care doctor have given you the information for neurologist to follow-up with.  Lidoderm patches, muscle relaxer which I have prescribed you, massage, warm compresses will be helpful.  The cold compresses that you have been doing are most likely making the pain and stiffness worse.  You do have Valium that you may continue use as prescribed.  Please use Tylenol or ibuprofen for pain.  You may use 600 mg ibuprofen every 6 hours or 1000 mg of Tylenol every 6 hours.  You may choose to alternate between the 2.  This would be most effective.  Not to exceed 4 g of Tylenol within 24 hours.  Not to exceed 3200 mg ibuprofen 24 hours.   Next steps include seeing a primary care doctor -- considering physical therapy.

## 2023-01-30 NOTE — ED Notes (Signed)
Pt requesting pain meds. Stevphen Meuse, PA notified

## 2023-02-08 ENCOUNTER — Other Ambulatory Visit: Payer: Self-pay

## 2023-02-08 ENCOUNTER — Emergency Department (HOSPITAL_COMMUNITY)
Admission: EM | Admit: 2023-02-08 | Discharge: 2023-02-08 | Disposition: A | Discharge status: Home / Self Care | Payer: 59

## 2023-02-08 ENCOUNTER — Encounter (HOSPITAL_COMMUNITY): Payer: Self-pay

## 2023-02-08 DIAGNOSIS — M545 Low back pain, unspecified: Secondary | ICD-10-CM | POA: Diagnosis present

## 2023-02-08 DIAGNOSIS — M5442 Lumbago with sciatica, left side: Secondary | ICD-10-CM

## 2023-02-08 MED ORDER — ACETAMINOPHEN 325 MG PO TABS
650.0000 mg | ORAL_TABLET | Freq: Once | ORAL | Status: AC
  Administered 2023-02-08: 650 mg via ORAL
  Filled 2023-02-08: qty 2

## 2023-02-08 MED ORDER — KETOROLAC TROMETHAMINE 15 MG/ML IJ SOLN
15.0000 mg | Freq: Once | INTRAMUSCULAR | Status: AC
  Administered 2023-02-08: 15 mg via INTRAVENOUS
  Filled 2023-02-08: qty 1

## 2023-02-08 MED ORDER — GABAPENTIN 300 MG PO CAPS
300.0000 mg | ORAL_CAPSULE | Freq: Once | ORAL | Status: AC
  Administered 2023-02-08: 300 mg via ORAL
  Filled 2023-02-08: qty 1

## 2023-02-08 MED ORDER — DEXAMETHASONE SODIUM PHOSPHATE 10 MG/ML IJ SOLN
10.0000 mg | Freq: Once | INTRAMUSCULAR | Status: AC
  Administered 2023-02-08: 10 mg via INTRAVENOUS
  Filled 2023-02-08: qty 1

## 2023-02-08 MED ORDER — METHOCARBAMOL 500 MG PO TABS: 500.0000 mg | ORAL_TABLET | Freq: Two times a day (BID) | ORAL | 0 refills | Status: AC

## 2023-02-08 MED ORDER — METHOCARBAMOL 500 MG PO TABS
1000.0000 mg | ORAL_TABLET | Freq: Once | ORAL | Status: AC
  Administered 2023-02-08: 1000 mg via ORAL
  Filled 2023-02-08: qty 2

## 2023-02-08 MED ORDER — GABAPENTIN 300 MG PO CAPS: 300.0000 mg | ORAL_CAPSULE | Freq: Three times a day (TID) | ORAL | 0 refills | Status: AC

## 2023-02-08 MED ORDER — LIDOCAINE 5 % EX PTCH
1.0000 | MEDICATED_PATCH | Freq: Once | CUTANEOUS | Status: DC
  Administered 2023-02-08: 1 via TRANSDERMAL
  Filled 2023-02-08: qty 1

## 2023-02-08 MED ORDER — OXYCODONE-ACETAMINOPHEN 5-325 MG PO TABS
1.0000 | ORAL_TABLET | Freq: Once | ORAL | Status: AC
  Administered 2023-02-08: 1 via ORAL
  Filled 2023-02-08: qty 1

## 2023-02-08 MED ORDER — LIDOCAINE 4 % EX PTCH: 1.0000 | MEDICATED_PATCH | CUTANEOUS | 0 refills | Status: AC
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
## 2023-02-08 NOTE — Discharge Instructions (Signed)
Please follow with your primary doctor and your pain management doctor as planned.  Return immediately for fevers, chills, weakness in your lower extremities, numbness in your lower extremities, numbness in your genital area, severe pain or any new or worsening symptoms that are concerning to you.
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
